# Patient Record
Sex: Female | Born: 1955 | Race: Black or African American | Hispanic: No | Marital: Single | Smoking: Current every day smoker
Health system: Southern US, Community
[De-identification: ages and names within clinical notes are randomized; demographics above are authoritative.]

## PROBLEM LIST (undated history)

## (undated) DIAGNOSIS — I10 Essential (primary) hypertension: Secondary | ICD-10-CM

## (undated) DIAGNOSIS — F32A Depression, unspecified: Secondary | ICD-10-CM

## (undated) DIAGNOSIS — K219 Gastro-esophageal reflux disease without esophagitis: Secondary | ICD-10-CM

## (undated) DIAGNOSIS — F419 Anxiety disorder, unspecified: Secondary | ICD-10-CM

## (undated) DIAGNOSIS — F329 Major depressive disorder, single episode, unspecified: Secondary | ICD-10-CM

## (undated) DIAGNOSIS — H409 Unspecified glaucoma: Secondary | ICD-10-CM

## (undated) DIAGNOSIS — D242 Benign neoplasm of left breast: Secondary | ICD-10-CM

## (undated) HISTORY — PX: EYE SURGERY: SHX253

## (undated) HISTORY — PX: OTHER SURGICAL HISTORY: SHX169

## (undated) HISTORY — PX: TUBAL LIGATION: SHX77

---
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
## 2000-09-16 ENCOUNTER — Other Ambulatory Visit: Admission: RE | Admit: 2000-09-16 | Discharge: 2000-09-16 | Payer: Self-pay | Admitting: Family Medicine

## 2001-07-30 ENCOUNTER — Other Ambulatory Visit: Admission: RE | Admit: 2001-07-30 | Discharge: 2001-07-30 | Payer: Self-pay | Admitting: Family Medicine

## 2001-08-02 ENCOUNTER — Encounter: Payer: Self-pay | Admitting: Family Medicine

## 2001-08-02 ENCOUNTER — Ambulatory Visit (HOSPITAL_COMMUNITY): Admission: RE | Admit: 2001-08-02 | Discharge: 2001-08-02 | Payer: Self-pay | Admitting: *Deleted

## 2001-08-05 ENCOUNTER — Encounter: Payer: Self-pay | Admitting: Family Medicine

## 2001-08-05 ENCOUNTER — Ambulatory Visit (HOSPITAL_COMMUNITY): Admission: RE | Admit: 2001-08-05 | Discharge: 2001-08-05 | Payer: Self-pay | Admitting: Family Medicine

## 2001-08-23 ENCOUNTER — Emergency Department (HOSPITAL_COMMUNITY): Admission: EM | Admit: 2001-08-23 | Discharge: 2001-08-23 | Payer: Self-pay | Admitting: Emergency Medicine

## 2002-05-03 ENCOUNTER — Encounter: Payer: Self-pay | Admitting: Emergency Medicine

## 2002-05-03 ENCOUNTER — Emergency Department (HOSPITAL_COMMUNITY): Admission: EM | Admit: 2002-05-03 | Discharge: 2002-05-03 | Payer: Self-pay | Admitting: Emergency Medicine

## 2005-04-24 ENCOUNTER — Ambulatory Visit: Payer: Self-pay | Admitting: Family Medicine

## 2005-04-29 ENCOUNTER — Ambulatory Visit (HOSPITAL_COMMUNITY): Admission: RE | Admit: 2005-04-29 | Discharge: 2005-04-29 | Payer: Self-pay | Admitting: Family Medicine

## 2005-05-22 ENCOUNTER — Ambulatory Visit: Payer: Self-pay | Admitting: Family Medicine

## 2006-05-05 HISTORY — PX: COLONOSCOPY WITH ESOPHAGOGASTRODUODENOSCOPY (EGD): SHX5779

## 2006-06-04 ENCOUNTER — Ambulatory Visit: Payer: Self-pay | Admitting: Family Medicine

## 2006-06-04 ENCOUNTER — Encounter: Payer: Self-pay | Admitting: Family Medicine

## 2006-06-04 ENCOUNTER — Other Ambulatory Visit: Admission: RE | Admit: 2006-06-04 | Discharge: 2006-06-04 | Payer: Self-pay | Admitting: Family Medicine

## 2006-06-04 LAB — CONVERTED CEMR LAB: Pap Smear: NORMAL

## 2006-06-09 ENCOUNTER — Ambulatory Visit (HOSPITAL_COMMUNITY): Admission: RE | Admit: 2006-06-09 | Discharge: 2006-06-09 | Payer: Self-pay | Admitting: Family Medicine

## 2006-06-24 ENCOUNTER — Ambulatory Visit: Payer: Self-pay | Admitting: Gastroenterology

## 2006-07-03 ENCOUNTER — Encounter (INDEPENDENT_AMBULATORY_CARE_PROVIDER_SITE_OTHER): Payer: Self-pay | Admitting: Specialist

## 2006-07-03 ENCOUNTER — Ambulatory Visit (HOSPITAL_COMMUNITY): Admission: RE | Admit: 2006-07-03 | Discharge: 2006-07-03 | Payer: Self-pay | Admitting: Gastroenterology

## 2006-07-03 ENCOUNTER — Ambulatory Visit: Payer: Self-pay | Admitting: Gastroenterology

## 2006-10-15 ENCOUNTER — Ambulatory Visit: Payer: Self-pay | Admitting: Family Medicine

## 2007-05-06 ENCOUNTER — Encounter: Payer: Self-pay | Admitting: Family Medicine

## 2007-06-28 ENCOUNTER — Ambulatory Visit: Payer: Self-pay | Admitting: Family Medicine

## 2007-07-07 ENCOUNTER — Encounter: Payer: Self-pay | Admitting: Family Medicine

## 2007-07-07 ENCOUNTER — Ambulatory Visit (HOSPITAL_COMMUNITY): Admission: RE | Admit: 2007-07-07 | Discharge: 2007-07-07 | Payer: Self-pay | Admitting: Family Medicine

## 2007-07-07 LAB — CONVERTED CEMR LAB
ALT: 22 units/L (ref 0–35)
AST: 25 units/L (ref 0–37)
Albumin: 4.8 g/dL (ref 3.5–5.2)
CO2: 16 meq/L — ABNORMAL LOW (ref 19–32)
Calcium: 9.8 mg/dL (ref 8.4–10.5)
Cholesterol: 221 mg/dL — ABNORMAL HIGH (ref 0–200)
HDL: 52 mg/dL (ref >39)
Sodium: 138 meq/L (ref 135–145)
Total Bilirubin: 0.9 mg/dL (ref 0.3–1.2)
Total CHOL/HDL Ratio: 4.3
Total Protein: 7.8 g/dL (ref 6.0–8.3)
Triglycerides: 139 mg/dL (ref <150)

## 2007-10-12 ENCOUNTER — Ambulatory Visit: Payer: Self-pay | Admitting: Family Medicine

## 2007-10-15 ENCOUNTER — Encounter: Payer: Self-pay | Admitting: Family Medicine

## 2007-10-15 DIAGNOSIS — E669 Obesity, unspecified: Secondary | ICD-10-CM

## 2007-10-15 DIAGNOSIS — F329 Major depressive disorder, single episode, unspecified: Secondary | ICD-10-CM

## 2007-10-15 DIAGNOSIS — F17200 Nicotine dependence, unspecified, uncomplicated: Secondary | ICD-10-CM

## 2007-10-15 DIAGNOSIS — N61 Mastitis without abscess: Secondary | ICD-10-CM | POA: Insufficient documentation

## 2007-10-15 DIAGNOSIS — M79609 Pain in unspecified limb: Secondary | ICD-10-CM

## 2007-10-19 ENCOUNTER — Ambulatory Visit: Payer: Self-pay | Admitting: Family Medicine

## 2008-12-01 ENCOUNTER — Ambulatory Visit: Payer: Self-pay | Admitting: Family Medicine

## 2008-12-02 DIAGNOSIS — B369 Superficial mycosis, unspecified: Secondary | ICD-10-CM | POA: Insufficient documentation

## 2008-12-02 DIAGNOSIS — K649 Unspecified hemorrhoids: Secondary | ICD-10-CM

## 2008-12-02 DIAGNOSIS — K621 Rectal polyp: Secondary | ICD-10-CM

## 2008-12-02 DIAGNOSIS — K620 Anal polyp: Secondary | ICD-10-CM | POA: Insufficient documentation

## 2008-12-02 DIAGNOSIS — E785 Hyperlipidemia, unspecified: Secondary | ICD-10-CM

## 2008-12-05 ENCOUNTER — Encounter: Payer: Self-pay | Admitting: Family Medicine

## 2008-12-05 LAB — CONVERTED CEMR LAB
Basophils Absolute: 0.1 10*3/uL (ref 0.0–0.1)
Basophils Relative: 1 % (ref 0–1)
Calcium: 9.1 mg/dL (ref 8.4–10.5)
Cholesterol: 165 mg/dL (ref 0–200)
Lymphocytes Relative: 34 % (ref 12–46)
Neutro Abs: 4.4 10*3/uL (ref 1.7–7.7)
Neutrophils Relative %: 58 % (ref 43–77)
Platelets: 221 10*3/uL (ref 150–400)
Potassium: 4.6 meq/L (ref 3.5–5.3)
RDW: 13.9 % (ref 11.5–15.5)
Sodium: 143 meq/L (ref 135–145)
Total CHOL/HDL Ratio: 3.2
Triglycerides: 161 mg/dL — ABNORMAL HIGH (ref <150)
VLDL: 32 mg/dL (ref 0–40)

## 2008-12-06 ENCOUNTER — Ambulatory Visit (HOSPITAL_COMMUNITY): Admission: RE | Admit: 2008-12-06 | Discharge: 2008-12-06 | Payer: Self-pay | Admitting: Family Medicine

## 2009-01-26 ENCOUNTER — Ambulatory Visit: Payer: Self-pay | Admitting: Family Medicine

## 2009-01-26 ENCOUNTER — Other Ambulatory Visit: Admission: RE | Admit: 2009-01-26 | Discharge: 2009-01-26 | Payer: Self-pay | Admitting: Family Medicine

## 2009-01-26 ENCOUNTER — Encounter: Payer: Self-pay | Admitting: Family Medicine

## 2009-01-26 DIAGNOSIS — N760 Acute vaginitis: Secondary | ICD-10-CM | POA: Insufficient documentation

## 2009-01-26 LAB — CONVERTED CEMR LAB: Chlamydia, DNA Probe: NEGATIVE

## 2009-01-27 ENCOUNTER — Encounter: Payer: Self-pay | Admitting: Family Medicine

## 2009-01-29 ENCOUNTER — Telehealth: Payer: Self-pay | Admitting: Family Medicine

## 2009-01-29 LAB — CONVERTED CEMR LAB
Candida species: NEGATIVE
Gardnerella vaginalis: POSITIVE — AB

## 2009-02-05 ENCOUNTER — Telehealth: Payer: Self-pay | Admitting: Family Medicine

## 2009-02-05 DIAGNOSIS — L0291 Cutaneous abscess, unspecified: Secondary | ICD-10-CM

## 2009-02-05 DIAGNOSIS — L0390 Cellulitis, unspecified: Secondary | ICD-10-CM

## 2009-02-16 ENCOUNTER — Telehealth: Payer: Self-pay | Admitting: Family Medicine

## 2009-06-07 ENCOUNTER — Ambulatory Visit: Payer: Self-pay | Admitting: Family Medicine

## 2009-06-07 DIAGNOSIS — J019 Acute sinusitis, unspecified: Secondary | ICD-10-CM | POA: Insufficient documentation

## 2009-06-07 DIAGNOSIS — R519 Headache, unspecified: Secondary | ICD-10-CM | POA: Insufficient documentation

## 2009-06-07 DIAGNOSIS — R51 Headache: Secondary | ICD-10-CM

## 2009-06-07 DIAGNOSIS — R079 Chest pain, unspecified: Secondary | ICD-10-CM | POA: Insufficient documentation

## 2009-07-13 ENCOUNTER — Ambulatory Visit: Payer: Self-pay | Admitting: Family Medicine

## 2009-07-17 ENCOUNTER — Encounter: Payer: Self-pay | Admitting: Physician Assistant

## 2009-07-17 LAB — CONVERTED CEMR LAB: GC Probe Amp, Genital: NEGATIVE

## 2009-07-18 LAB — CONVERTED CEMR LAB
Candida species: NEGATIVE
Gardnerella vaginalis: POSITIVE — AB

## 2009-07-31 ENCOUNTER — Ambulatory Visit: Payer: Self-pay | Admitting: Family Medicine

## 2009-07-31 DIAGNOSIS — B009 Herpesviral infection, unspecified: Secondary | ICD-10-CM | POA: Insufficient documentation

## 2009-07-31 DIAGNOSIS — L259 Unspecified contact dermatitis, unspecified cause: Secondary | ICD-10-CM

## 2009-08-02 ENCOUNTER — Telehealth: Payer: Self-pay | Admitting: Physician Assistant

## 2010-03-21 ENCOUNTER — Emergency Department (HOSPITAL_COMMUNITY): Admission: EM | Admit: 2010-03-21 | Discharge: 2010-03-21 | Payer: Self-pay | Admitting: Emergency Medicine

## 2010-05-26 ENCOUNTER — Encounter: Payer: Self-pay | Admitting: Family Medicine

## 2010-06-04 NOTE — Letter (Signed)
Summary: history and physical  history and physical   Imported By: Curtis Sites 10/08/2009 10:22:29  _____________________________________________________________________  External Attachment:    Type:   Image     Comment:   External Document

## 2010-06-04 NOTE — Letter (Signed)
Summary: progress notes  progress notes   Imported By: Curtis Sites 10/08/2009 10:27:50  _____________________________________________________________________  External Attachment:    Type:   Image     Comment:   External Document

## 2010-06-04 NOTE — Letter (Signed)
Summary: consult  consult   Imported By: Curtis Sites 10/08/2009 10:24:15  _____________________________________________________________________  External Attachment:    Type:   Image     Comment:   External Document

## 2010-06-04 NOTE — Letter (Signed)
Summary: phone notes  phone notes   Imported By: Curtis Sites 10/08/2009 10:24:43  _____________________________________________________________________  External Attachment:    Type:   Image     Comment:   External Document

## 2010-06-04 NOTE — Letter (Signed)
Summary: demographic  demographic   Imported By: Curtis Sites 10/08/2009 10:21:20  _____________________________________________________________________  External Attachment:    Type:   Image     Comment:   External Document

## 2010-06-04 NOTE — Letter (Signed)
Summary: misc  misc   Imported By: Curtis Sites 10/08/2009 10:26:45  _____________________________________________________________________  External Attachment:    Type:   Image     Comment:   External Document

## 2010-06-04 NOTE — Letter (Signed)
Summary: labs  labs   Imported By: Curtis Sites 10/08/2009 10:25:36  _____________________________________________________________________  External Attachment:    Type:   Image     Comment:   External Document

## 2010-06-04 NOTE — Assessment & Plan Note (Signed)
Summary: OV   Vital Signs:  Patient profile:   55 year old female Menstrual status:  postmenopausal Height:      64 inches Weight:      182 pounds BMI:     31.35 O2 Sat:      96 % Pulse rate:   112 / minute Pulse rhythm:   regular Resp:     16 per minute BP sitting:   130 / 82 Cuff size:   regular  Vitals Entered By: Everitt Amber (June 07, 2009 2:50 PM)  Nutrition Counseling: Patient's BMI is greater than 25 and therefore counseled on weight management options. CC: having shooting pains in left breast for the past 2 days, nasal and chest congestion bringing up yellowish phlegm. Short winded and states she has been dizzy for 2 days   CC:  having shooting pains in left breast for the past 2 days and nasal and chest congestion bringing up yellowish phlegm. Short winded and states she has been dizzy for 2 days.  History of Present Illness: 2 day h/o dizziness and heasd congestion,, yellow nasal drainge, mainly from the right nostril.No sore throat cough productive of sputum. Sticking left breasrt pain  and spasm in the left forearm which radiates to the elbow which will lock up x 2 days.  Preventive Screening-Counseling & Management  Alcohol-Tobacco     Smoking Cessation Counseling: yes  Current Medications (verified): 1)  None  Allergies (verified): No Known Drug Allergies  Review of Systems      See HPI General:  Complains of chills, fatigue, and malaise; denies fever and loss of appetite. ENT:  Complains of nasal congestion, postnasal drainage, and sinus pressure. CV:  Complains of chest pain or discomfort and shortness of breath with exertion; denies difficulty breathing while lying down, lightheadness, near fainting, palpitations, and swelling of feet; primarily with cough x 2 days. Resp:  Complains of cough and sputum productive; 2 day history. GI:  Denies abdominal pain, constipation, nausea, and vomiting. GU:  Denies dysuria and urinary frequency. MS:  Complains  of joint pain, low back pain, mid back pain, muscle weakness, and stiffness. Derm:  Denies itching and rash. Neuro:  Complains of headaches and sensation of room spinning; denies poor balance. Psych:  Complains of anxiety and depression; denies easily tearful, irritability, suicidal thoughts/plans, thoughts of violence, and unusual visions or sounds. Endo:  Denies cold intolerance, excessive hunger, excessive thirst, excessive urination, heat intolerance, polyuria, and weight change. Heme:  Denies abnormal bruising and bleeding. Allergy:  Complains of seasonal allergies and sneezing; denies hives or rash.  Physical Exam  General:  Well-developed,well-nourished,in no acute distress; alert,appropriate and cooperative throughout examination HEENT: No facial asymmetry,  EOMI, frontal and maxillary sinus tenderness, TM's Clear, oropharynx  erythematous, poor dentition.   Chest: Clear to auscultation bilaterally.decreased air entry throughout Reproducible ant chest wall tenderness, 3rd, 4th and 5ht CC junctions CVS: S1, S2, No murmurs, No S3.   Abd: Soft, Nontender.  MS: Adequate ROM spine, hips, shoulders and knees.  Ext: No edema.   CNS: CN 2-12 intact, power tone and sensation normal throughout.   Skin: Intact, no visible lesions or rashesLeft axillary adenitis.  Psych: Good eye contact, normal affect.  Memory intact, not anxious or depressed appearing.    Impression & Recommendations:  Problem # 1:  HEADACHE (ICD-784.0) Assessment Comment Only  Orders: Ketorolac-Toradol 15mg  (Z6010) Admin of Therapeutic Inj  intramuscular or subcutaneous (93235)  Problem # 2:  CHEST PAIN UNSPECIFIED (ICD-786.50)  Assessment: Comment Only  Orders: EKG w/ Interpretation (93000)  nSR, no evidence of ischemia, pt reassured that noi sign of cAD  Problem # 3:  ACUTE SINUSITIS, UNSPECIFIED (ICD-461.9) Assessment: Comment Only  The following medications were removed from the medication list:     Doxycycline Hyclate 100 Mg Caps (Doxycycline hyclate) .Marland Kitchen... Take 1 capsule by mouth two times a day    Flagyl 500 Mg Tabs (Metronidazole) .Marland Kitchen... Take 1 tablet by mouth two times a day Her updated medication list for this problem includes:    Penicillin V Potassium 500 Mg Tabs (Penicillin v potassium) .Marland Kitchen... Take 1 tablet by mouth three times a day    Tessalon Perles 100 Mg Caps (Benzonatate) .Marland Kitchen... Take 1 capsule by mouth three times a day  Complete Medication List: 1)  Penicillin V Potassium 500 Mg Tabs (Penicillin v potassium) .... Take 1 tablet by mouth three times a day 2)  Tessalon Perles 100 Mg Caps (Benzonatate) .... Take 1 capsule by mouth three times a day 3)  Fluconazole 150 Mg Tabs (Fluconazole) .... Take 1 tablet by mouth once a day as needed  Patient Instructions: 1)  Please schedule a follow-up appointment in 3 months.PLS cancel earlier appt. 2)  Tobacco is very bad for your health and your loved ones! You Should stop smoking!. 3)  Stop Smoking Tips: Choose a Quit date. Cut down before the Quit date. decide what you will do as a substitute when you feel the urge to smoke(gum,toothpick,exercise). 4)  It is important that you exercise regularly at least 20 minutes 5 times a week. If you develop chest pain, have severe difficulty breathing, or feel very tired , stop exercising immediately and seek medical attention. 5)  You need to lose weight. Consider a lower calorie diet and regular exercise.  6)  you are  being treated for sinusitis, headache and left axillary adenitis, you will get injections in the office and meds are also sent to your pharmacy. Prescriptions: FLUCONAZOLE 150 MG TABS (FLUCONAZOLE) Take 1 tablet by mouth once a day as needed  #3 x 0   Entered and Authorized by:   Syliva Overman MD   Signed by:   Syliva Overman MD on 06/07/2009   Method used:   Electronically to        [facility] [address] [phone]* (retail)       [address] [place], [place]   [address]       Ph: [phone]       Fax: [phone]   RxID:   [id number] TESSALON PERLES 100 MG CAPS (BENZONATATE) Take 1 capsule by mouth three times a day  #30 x 0   Entered and Authorized by:   Syliva Overman MD   Signed by:   Syliva Overman MD on 06/07/2009   Method used:   Electronically to        [facility] [address] [phone]* (retail)       [address] [place]  [address]       Ph: [phone]       Fax: [phone]   RxID:   [id number] PENICILLIN V POTASSIUM 500 MG TABS (PENICILLIN V POTASSIUM) Take 1 tablet by mouth three times a day  #30 x 0   Entered and Authorized by:   Syliva Overman MD   Signed by:   Syliva Overman MD on 06/07/2009   Method used:   Electronically  to        [facility] [phone]* (retail)       [address] [place], [place]  [address]       Ph: [phone]       Fax: [phone]   RxID:   [id number]    Medication Administration  Injection # 1:    Medication: Ketorolac-Toradol 15mg     Diagnosis: HEADACHE (ICD-784.0)    Route: IM    Site: RUOQ gluteus    Exp Date: 01/2012    Lot #: [id number]    Mfr: novaplus    Comments: 60mg  given     Patient tolerated injection without complications    Given by: Everitt Amber (June 07, 2009 4:23 PM)  Orders Added: 1)  Est. Patient Level IV [[address]] 2)  EKG w/ Interpretation [93000] 3)  Ketorolac-Toradol 15mg  [J1885] 4)  Admin of Therapeutic Inj  intramuscular or subcutaneous [18841]

## 2010-06-04 NOTE — Letter (Signed)
Summary: xray  xray   Imported By: Curtis Sites 10/08/2009 10:23:23  _____________________________________________________________________  External Attachment:    Type:   Image     Comment:   External Document

## 2010-06-04 NOTE — Progress Notes (Signed)
Summary: meds  Phone Note Refill Request   Summary of Call: would like to speak with Davide Risdon. about meds. [phone] Initial call taken by: Rudene Anda,  August 02, 2009 10:50 AM  Follow-up for Phone Call        patient wants to speak with you reguarding her new diagnosis and medication. offered to discuss and also mail her info but patient declined, she feels more comfortable discussing with Christos Mixson Follow-up by: Adella Hare LPN,  August 02, 2009 10:59 AM  Additional Follow-up for Phone Call Additional follow up Details #1::        Answered pts questions re: HSV.  Primarily about transmission. Refilled Valtrex to have on hand for future outbreaks. Pt states she is losing her ins effective tomorrow. Additional Follow-up by: Esperanza Sheets PA,  August 02, 2009 2:58 PM    New/Updated Medications: VALTREX 500 MG TABS (VALACYCLOVIR HCL) take 1 two times a day as directed Prescriptions: VALTREX 500 MG TABS (VALACYCLOVIR HCL) take 1 two times a day as directed  #60 x 0   Entered and Authorized by:   Esperanza Sheets PA   Signed by:   Esperanza Sheets PA on 08/02/2009   Method used:   Electronically to        [facility] [phone]* (retail)       [address], [place]  [address]       Ph: [phone]       Fax: [phone]   RxID:   [id number]

## 2010-06-04 NOTE — Assessment & Plan Note (Signed)
Summary: vaginal irritation - room 1   Vital Signs:  Patient profile:   55 year old female Menstrual status:  postmenopausal Height:      64 inches Weight:      184.50 pounds BMI:     31.78 O2 Sat:      96 % on Room air Pulse rate:   92 / minute Resp:     16 per minute BP sitting:   140 / 70  (left arm)  Vitals Entered By: Adella Hare LPN (July 13, 2009 11:11 AM)  Nutrition Counseling: Patient's BMI is greater than 25 and therefore counseled on weight management options. CC: vaginal irritation Is Patient Diabetic? No Pain Assessment Patient in pain? no        CC:  vaginal irritation.  History of Present Illness: Pt is here today with c/o genital itching, irritation & vag dischg x 2-3 days.  No pelvic pain.  No vag bleeding.  She did have a new partner recently , but states he wore a condom.  Pt did have trichomonis this past fall.  She did complete her prescription but is uncertain if her long term partner took his.  She was also recently seen for cough & congestion.  She completed her antibiotics.  States her syptoms are much better but still has a little cough.  She smokes off & on.  "I had one today."  Current Medications (verified): 1)  Penicillin V Potassium 500 Mg Tabs (Penicillin V Potassium) .... Take 1 Tablet By Mouth Three Times A Day 2)  Tessalon Perles 100 Mg Caps (Benzonatate) .... Take 1 Capsule By Mouth Three Times A Day 3)  Fluconazole 150 Mg Tabs (Fluconazole) .... Take 1 Tablet By Mouth Once A Day As Needed  Allergies (verified): No Known Drug Allergies  Past History:  Past medical history reviewed for relevance to current acute and chronic problems.  Past Medical History: Reviewed history from 10/15/2007 and no changes required. INFLAMMATORY DISEASE OF BREAST (ICD-611.0) LEG PAIN, BILATERAL (ICD-729.5) NICOTINE ADDICTION (ICD-305.1) DEPRESSION (ICD-311) OBESITY (ICD-278.00)  Review of Systems General:  Denies chills and fever. ENT:  Denies  nasal congestion and sore throat. Resp:  Complains of cough. GI:  Denies abdominal pain and change in bowel habits. GU:  Complains of discharge; denies abnormal vaginal bleeding, dysuria, and urinary frequency.  Physical Exam  General:  Well-developed,well-nourished,in no acute distress; alert,appropriate and cooperative throughout examination Head:  Normocephalic and atraumatic without obvious abnormalities. No apparent alopecia or balding. Lungs:  Normal respiratory effort, chest expands symmetrically. Lungs are clear to auscultation, no crackles or wheezes. Heart:  Normal rate and regular rhythm. S1 and S2 normal without gallop, murmur, click, rub or other extra sounds. Genitalia:  normal introitus, no external lesions, no vaginal or cervical lesions, normal uterus size and position, no adnexal masses or tenderness, and small amt of vaginal discharge- thin & white.  Psych:  Cognition and judgment appear intact. Alert and cooperative with normal attention span and concentration. No apparent delusions, illusions, hallucinations   Impression & Recommendations:  Problem # 1:  VAGINITIS (ICD-616.10) Assessment New  Her updated medication list for this problem includes:    Penicillin V Potassium 500 Mg Tabs (Penicillin v potassium) .Marland Kitchen... Take 1 tablet by mouth three times a day    Metronidazole 500 Mg Tabs (Metronidazole) ..... One two times a day x 7 days.  no alcohol while taking or for 3 days after.  Orders: T-Wet Prep by Molecular Probe [phone]) T-Chlamydia & GC  Probe, Genital ([id number])  Problem # 2:  NICOTINE ADDICTION (ICD-305.1) Assessment: Unchanged  Encouraged smoking cessation.  Pt will go a couple of days without smoking.  I believe she can quit on her own & encouraged her to do so.  Complete Medication List: 1)  Penicillin V Potassium 500 Mg Tabs (Penicillin v potassium) .... Take 1 tablet by mouth three times a day 2)  Tessalon Perles 100 Mg Caps  (Benzonatate) .... Take 1 capsule by mouth three times a day 3)  Fluconazole 150 Mg Tabs (Fluconazole) .... Take 1 tablet by mouth once a day as needed 4)  Metronidazole 500 Mg Tabs (Metronidazole) .... One two times a day x 7 days.  no alcohol while taking or for 3 days after.  Patient Instructions: 1)  Please schedule a follow-up appointment as needed. 2)  Tobacco is very bad for your health and your loved ones! You Should stop smoking!. 3)  Stop Smoking Tips: Choose a Quit date. Cut down before the Quit date. decide what you will do as a substitute when you feel the urge to smoke(gum,toothpick,exercise). 4)  It is important that you exercise regularly at least 20 minutes 5 times a week. If you develop chest pain, have severe difficulty breathing, or feel very tired , stop exercising immediately and seek medical attention. 5)  You need to lose weight. Consider a lower calorie diet and regular exercise.  6)  I have prescribed antibiotics for your vaginal infection to the pharmacy. Prescriptions: METRONIDAZOLE 500 MG TABS (METRONIDAZOLE) one two times a day x 7 days.  No alcohol while taking or for 3 days after.  #14 x 0   Entered and Authorized by:   Esperanza Sheets PA   Signed by:   Adella Hare LPN on 16/02/9603   Method used:   Electronically to        [facility] [address] [phone]* (retail)       [address], [place]  [address]       Ph: [phone]       Fax: [phone]   RxID:   [id number]

## 2010-06-04 NOTE — Assessment & Plan Note (Signed)
Summary: rash- room 2   Vital Signs:  Patient profile:   55 year old female Menstrual status:  postmenopausal Height:      64 inches Weight:      183.75 pounds BMI:     31.65 O2 Sat:      98 % on Room air Pulse rate:   92 / minute Resp:     16 per minute BP sitting:   142 / 90  (left arm)  Vitals Entered By: Adella Hare LPN (July 31, 2009 3:20 PM) CC: vaginal irritation and rash on buttocks Is Patient Diabetic? No Pain Assessment Patient in pain? no        CC:  vaginal irritation and rash on buttocks.  History of Present Illness: Pt is here today with c/o a recurrent rash on her buttock.  States that she has had this off & on x mos but seems to be coming more freq lately.  Starts as an itch.  Then gets bumps that are tender.  Scabs up. Then heals & goes away.  Also recently dx with BV.  States she did not take the pills as prescribed.  Sxs are the same.  Also admits that she did drink ETOH with the pills even though was advised not to.   Current Medications (verified): 1)  Penicillin V Potassium 500 Mg Tabs (Penicillin V Potassium) .... Take 1 Tablet By Mouth Three Times A Day 2)  Tessalon Perles 100 Mg Caps (Benzonatate) .... Take 1 Capsule By Mouth Three Times A Day 3)  Fluconazole 150 Mg Tabs (Fluconazole) .... Take 1 Tablet By Mouth Once A Day As Needed  Allergies (verified): No Known Drug Allergies  Past History:  Past medical history reviewed for relevance to current acute and chronic problems.  Past Medical History: Reviewed history from 10/15/2007 and no changes required. INFLAMMATORY DISEASE OF BREAST (ICD-611.0) LEG PAIN, BILATERAL (ICD-729.5) NICOTINE ADDICTION (ICD-305.1) DEPRESSION (ICD-311) OBESITY (ICD-278.00)  Review of Systems General:  Denies chills and fever. GU:  Complains of discharge; denies dysuria and genital sores. Derm:  Complains of rash.  Physical Exam  General:  Well-developed,well-nourished,in no acute distress;  alert,appropriate and cooperative throughout examination Head:  Normocephalic and atraumatic without obvious abnormalities. No apparent alopecia or balding. Skin:  Rt medial buttock dime sized area of grouped vesicles with mildly erythematous base. Psych:  normally interactive, good eye contact, and not anxious appearing.     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  She denies prev hx of.  Not aware of partner having HSV either. Will order lab test to confirm. Valtrex prescription given to treat this outbreak.  Problem # 2:  BACTERIAL VAGINITIS (ICD-616.10) Assessment: Unchanged  The following medications were removed from the medication list:    Penicillin V Potassium 500 Mg Tabs (Penicillin v potassium) .Marland Kitchen... Take 1 tablet by mouth three times a day Her updated medication list for this problem includes:    Metrogel-vaginal 0.75 % Gel (Metronidazole) ..... Use 1 appl hs x 5 nights  Complete Medication List: 1)  Valtrex 500 Mg Tabs (Valacyclovir hcl) .... Take 1 two times a day x 3 days for rash 2)  Metrogel-vaginal 0.75 % Gel (Metronidazole) .... Use 1 appl hs x 5 nights  Other Orders: T-Herpes Simplex Type 2 ([id number])  Patient Instructions: 1)  Please schedule a follow-up appointment as needed. 2)  I have prescribed pills to take two times a  day for 3 days for the rash on your bottom.  I have also prescribed a vaginal cream to use at bedtime for 5 nights to treat your vaginal infection. Prescriptions: METROGEL-VAGINAL 0.75 % GEL (METRONIDAZOLE) use 1 appl HS x 5 nights  #1 x 0   Entered and Authorized by:   Esperanza Sheets PA   Signed by:   Esperanza Sheets PA on 07/31/2009   Method used:   Electronically to        [facility] [phone]* (retail)       [address] [place], [place]  [address]       Ph: [phone]       Fax: [phone]   RxID:   [id number] VALTREX 500 MG  TABS (VALACYCLOVIR HCL) take 1 two times a day x 3 days for rash  #6 x 0   Entered and Authorized by:   Esperanza Sheets PA   Signed by:   Esperanza Sheets PA on 07/31/2009   Method used:   Electronically to        [facility] [phone]* (retail)       [address] [place]  [address]       Ph: [phone]       Fax: [phone]   RxID:   [id number]

## 2010-09-20 NOTE — Consult Note (Signed)
NAME:  Alison Hansen, Alison Hansen              ACCOUNT NO.:  [id number]   MEDICAL RECORD NO.:  [id number]          PATIENT TYPE:  AMB   LOCATION:  DAY                           FACILITY:  [facility]   PHYSICIAN:  Kassie Mends, M.D.      DATE OF BIRTH:  Jan 29, 1956   DATE OF CONSULTATION:  06/24/2006  DATE OF DISCHARGE:                                 CONSULTATION   REASON FOR CONSULTATION:  Rectal bleeding and dyspepsia.   HISTORY OF PRESENT ILLNESS:  Alison Hansen is a 55 year old female with  rectal bleeding for 2 years. She has also had onset of heart burn and  indigestion over the last 4-5 months. She has had rectal bleeding for 2  years. She states that she has seen bleeding from her rectum every 3-4  days. The bleeding is in her stool. Sometimes it is more than others.  She has seen it run sometimes like a period. She has been attributing it  to hemorrhoids. She denies any diarrhea or black tarry stools.   Her heart burn and indigestion is 2-3 times a week. She states it  primarily is brought on by something she eats. She has abdominal pain  sometimes. She has gained 10 pounds over the last 6 months to a year.  Sometimes she strains when she has a bowel movement. She strains once a  week. She denies any difficulty swallowing. She was told she had  cirrhosis approximately 7-8 years ago. I have no record available to me  regarding her diagnosis of cirrhosis today.   PAST MEDICAL HISTORY:  1. Reported history of cirrhosis.  2. Back pain.   PAST SURGICAL HISTORY:  1. Tubal ligation.  2. Eye surgery.   ALLERGIES:  No known drug allergies.   MEDICATIONS:  1. Meloxicam 15 mg p.r.n. for back pain over the last 2 weeks.  2. Cyclobenzaprine 10 mg at h.s. p.r.n. off and on for the last 2      weeks.   The patient denies any use of aspirin, BC's or Goodie powders.   FAMILY HISTORY:  She denies any family history of breast cancer,  ovarian, uterine or colon cancer. She has no family history of  colon  polyps. Her brother had stomach cancer but was a frequent drinker of  wine.   SOCIAL HISTORY:  She has four children ages 30, 38, 39 and 42. She  formerly was a Scientist, product/process development. She smokes 1/2 pack a day. She drinks 3-4  beers a day at times. She may drink up to a 6-pack a day. Her last drink  was last night.   REVIEW OF SYSTEMS:  As per the HPI. All other systems negative.   PHYSICAL EXAMINATION:  VITAL SIGNS: Weight 191 pounds, height 5 feet, 4  inches, BMI 32.8 (obese). Temperature 98.3, blood pressure 134/78, pulse  80.GENERAL: She is in no apparent distress. Alert and oriented x4. HEENT  EXAM: Normocephalic, atraumatic. Pupils are equal, round and reactive to  light. Mouth - no oral lesions, posterior pharynx without erythema or  exudate. NECK: Full range of motion, no lymphadenopathy.CARDIOVASCULAR  EXAM:  Regular rhythm, no murmur, normal S1 and S2. ABDOMEN: Bowel sounds  present, soft, nontender, nondistended. No rebound or guarding, unable  to appreciate hepatosplenomegaly. She has no abdominal bruits or  pulsatile masses, obese. EXTREMITIES: Are without cyanosis, clubbing or  edema. NEURO: She has no focal neurologic deficits.   ASSESSMENT:  Alison Hansen is a 55 year old female with rectal bleeding  for 2 years. She has reported history of cirrhosis and a known history  of alcohol abuse. The differential diagnosis includes internal  hemorrhoids, rectal polyp and a low likelihood of colorectal cancer. She  has new onset of dyspepsia and the differential diagnosis includes  gastric malignancy versus gastroesophageal reflux disease due to weight  gain over the last year and no therapy, versus alcoholic gastritis.   Thank you for allowing me to see Alison Hansen in consultation. My  recommendations are as follows.   RECOMMENDATIONS:  1. Will check CMP, PT, INR and CBC today to evaluate her liver disease      severity.  2. Will schedule a colonoscopy for evaluation of her  rectal bleeding      as well as screening.  3. Will schedule an esophagogastroduodenoscopy to evaluate for new      onset dyspepsia, and screen for varices.  4. Will make further recommendations in regard to her rectal bleeding      after her endoscopy is complete.  5. She has a follow-up visit scheduled for 6 weeks. I will call her      with the results of her lab evaluation.      Kassie Mends, M.D.  Electronically Signed     SM/MEDQ  D:  06/24/2006  T:  06/24/2006  Job:  [id number]   cc:   Milus Mallick. Lodema Hong, M.D.  Fax: [phone]

## 2010-09-20 NOTE — Op Note (Signed)
NAME:  Alison Hansen, Alison Hansen              ACCOUNT NO.:  [id number]   MEDICAL RECORD NO.:  [id number]          PATIENT TYPE:  AMB   LOCATION:  DAY                           FACILITY:  [facility]   PHYSICIAN:  Kassie Mends, M.D.      DATE OF BIRTH:  [birth year]   DATE OF PROCEDURE:  07/03/2006  DATE OF DISCHARGE:                               OPERATIVE REPORT   PROCEDURES:  1. Colonoscopy with cold forceps polypectomy.  2. Esophagogastroduodenoscopy with cold forceps biopsy.   INDICATIONS FOR PROCEDURE:  Ms. Munoz is a 55 year old female who  presented with rectal bleeding for 2 years.  She also had heartburn and  indigestion for 4-5 months which was new in onset.  She drinks 3-4 beers  at a time and sometimes a six pack a day.   FINDINGS:  1. Small rectal polyps removed via cold forceps.  Internal and external hemorrhoids which is likely the source of her  rectal bleeding.  Otherwise, normal colon without evidence of masses,  inflammatory changes, diverticula or AVMs.  1. Normal esophagus without evidence of Barrett's.  No esophageal      varices.  2. Diffuse erythema of the body of the stomach without ulcerations or      erosions.  Biopsies obtained to rule out H pylori or eosinophilic      esophagitis.  Her gastritis is likely secondary to alcohol use.      The differential diagnosis includes portal hypertensive      gastropathy.  She had no evidence of fundal varices.  Normal      duodenal bulb and second portion of the duodenum.  No duodenal      varices.   RECOMMENDATIONS:  1. I will call Ms. Smalling with the results of her biopsies.  She      already has a follow-up appointment to see me within the next 4      weeks.  2. She should avoid alcohol.  No aspirin, NSAIDs or anticoagulation      for the next 2 weeks.  3. Over-the-counter Prilosec daily.  She is given a handout on reflux,      gastritis, and gastric irritants.  She should avoid gastric      irritants.  4. She should also  add fiber to her diet.  She is given a handout on a      high-fiber diet.  5. She is given a handout also on management of hemorrhoids.  She is      given a prescription for Anusol HC one per rectum twice daily for 2      weeks.  She may repeat this once prior to her next visit.  She may      also add Colace 100 mg tablets twice daily until she sees me.  6. If her polyps are adenomatous then she will need a screening      colonoscopy in 5 years. If they are hyperplastic then she will need      a colonoscopy in 10 years.   MEDICATIONS:  1. Colonoscopy - Demerol 100 mg IV, Versed  6 mg IV.  2. Esophagogastroduodenoscopy - Versed 2 mg IV.   PROCEDURE TECHNIQUE:  Physical exam was performed and informed consent  was obtained from the patient after explaining the benefits, risks and  alternatives to the procedure.  The patient was connected to the monitor  and placed in the left lateral position.  Continuous oxygen was provided  by nasal cannula and IV medicine administered through an indwelling  cannula.  After administration of sedation and rectal exam, the  patient's rectum was intubated and the scope was advanced under direct  visualization to the cecum.  The scope was subsequently removed slowly  by carefully examining the color, texture, anatomy and integrity of the  mucosa on the way out.  The patient was then repositioned.  After the  colonoscopy, the patient's esophagus was intubated with a diagnostic  gastroscope.  The scope was advanced under direct visualization to  second portion of duodenum.  The scope was subsequently removed slowly  by carefully examining the color, texture, anatomy and integrity of the  mucosa on the way out.  The patient was recovered in the endoscopy suite  discharged home in satisfactory condition.      Kassie Mends, M.D.  Electronically Signed     SM/MEDQ  D:  07/03/2006  T:  07/03/2006  Job:  [id number]   cc:   Milus Mallick. Lodema Hong, M.D.  Fax:  [phone]

## 2011-06-25 ENCOUNTER — Emergency Department (HOSPITAL_COMMUNITY): Payer: Self-pay

## 2011-06-25 ENCOUNTER — Emergency Department (HOSPITAL_COMMUNITY)
Admission: EM | Admit: 2011-06-25 | Discharge: 2011-06-25 | Disposition: A | Discharge status: Home / Self Care | Payer: No Typology Code available for payment source | Attending: Emergency Medicine | Admitting: Emergency Medicine

## 2011-06-25 ENCOUNTER — Encounter (HOSPITAL_COMMUNITY): Payer: Self-pay | Admitting: *Deleted

## 2011-06-25 DIAGNOSIS — M542 Cervicalgia: Secondary | ICD-10-CM | POA: Insufficient documentation

## 2011-06-25 DIAGNOSIS — M545 Low back pain, unspecified: Secondary | ICD-10-CM | POA: Insufficient documentation

## 2011-06-25 DIAGNOSIS — S39012A Strain of muscle, fascia and tendon of lower back, initial encounter: Secondary | ICD-10-CM

## 2011-06-25 DIAGNOSIS — S161XXA Strain of muscle, fascia and tendon at neck level, initial encounter: Secondary | ICD-10-CM

## 2011-06-25 DIAGNOSIS — R51 Headache: Secondary | ICD-10-CM | POA: Insufficient documentation

## 2011-06-25 DIAGNOSIS — Y9289 Other specified places as the place of occurrence of the external cause: Secondary | ICD-10-CM | POA: Insufficient documentation

## 2011-06-25 DIAGNOSIS — S335XXA Sprain of ligaments of lumbar spine, initial encounter: Secondary | ICD-10-CM | POA: Insufficient documentation

## 2011-06-25 DIAGNOSIS — S139XXA Sprain of joints and ligaments of unspecified parts of neck, initial encounter: Secondary | ICD-10-CM | POA: Insufficient documentation

## 2011-06-25 MED ORDER — CYCLOBENZAPRINE HCL 10 MG PO TABS
10.0000 mg | ORAL_TABLET | Freq: Once | ORAL | Status: AC
  Administered 2011-06-25: 10 mg via ORAL
  Filled 2011-06-25: qty 1

## 2011-06-25 MED ORDER — CYCLOBENZAPRINE HCL 10 MG PO TABS: ORAL_TABLET | ORAL | Status: DC

## 2011-06-25 MED ORDER — IBUPROFEN 800 MG PO TABS
800.0000 mg | ORAL_TABLET | Freq: Once | ORAL | Status: AC
  Administered 2011-06-25: 800 mg via ORAL
  Filled 2011-06-25: qty 1
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
## 2011-06-25 NOTE — Discharge Instructions (Signed)
Cryotherapy Cryotherapy means treatment with cold. Ice or gel packs can be used to reduce both pain and swelling. Ice is the most helpful within the first 24 to 48 hours after an injury or flareup from overusing a muscle or joint. Sprains, strains, spasms, burning pain, shooting pain, and aches can all be eased with ice. Ice can also be used when recovering from surgery. Ice is effective, has very few side effects, and is safe for most people to use. PRECAUTIONS  Ice is not a safe treatment option for people with:  Raynaud's phenomenon. This is a condition affecting small blood vessels in the extremities. Exposure to cold may cause your problems to return.   Cold hypersensitivity. There are many forms of cold hypersensitivity, including:   Cold urticaria. Red, itchy hives appear on the skin when the tissues begin to warm after being iced.   Cold erythema. This is a red, itchy rash caused by exposure to cold.   Cold hemoglobinuria. Red blood cells break down when the tissues begin to warm after being iced. The hemoglobin that carry oxygen are passed into the urine because they cannot combine with blood proteins fast enough.   Numbness or altered sensitivity in the area being iced.  If you have any of the following conditions, do not use ice until you have discussed cryotherapy with your caregiver:  Heart conditions, such as arrhythmia, angina, or chronic heart disease.   High blood pressure.   Healing wounds or open skin in the area being iced.   Current infections.   Rheumatoid arthritis.   Poor circulation.   Diabetes.  Ice slows the blood flow in the region it is applied. This is beneficial when trying to stop inflamed tissues from spreading irritating chemicals to surrounding tissues. However, if you expose your skin to cold temperatures for too long or without the proper protection, you can damage your skin or nerves. Watch for signs of skin damage due to cold. HOME CARE  INSTRUCTIONS Follow these tips to use ice and cold packs safely.  Place a dry or damp towel between the ice and skin. A damp towel will cool the skin more quickly, so you may need to shorten the time that the ice is used.   For a more rapid response, add gentle compression to the ice.   Ice for no more than 10 to 20 minutes at a time. The bonier the area you are icing, the less time it will take to get the benefits of ice.   Check your skin after 5 minutes to make sure there are no signs of a poor response to cold or skin damage.   Rest 20 minutes or more in between uses.   Once your skin is numb, you can end your treatment. You can test numbness by very lightly touching your skin. The touch should be so light that you do not see the skin dimple from the pressure of your fingertip. When using ice, most people will feel these normal sensations in this order: cold, burning, aching, and numbness.   Do not use ice on someone who cannot communicate their responses to pain, such as small children or people with dementia.  HOW TO MAKE AN ICE PACK Ice packs are the most common way to use ice therapy. Other methods include ice massage, ice baths, and cryo-sprays. Muscle creams that cause a cold, tingly feeling do not offer the same benefits that ice offers and should not be used as a substitute  unless recommended by your caregiver. To make an ice pack, do one of the following:  Place crushed ice or a bag of frozen vegetables in a sealable plastic bag. Squeeze out the excess air. Place this bag inside another plastic bag. Slide the bag into a pillowcase or place a damp towel between your skin and the bag.   Mix 3 parts water with 1 part rubbing alcohol. Freeze the mixture in a sealable plastic bag. When you remove the mixture from the freezer, it will be slushy. Squeeze out the excess air. Place this bag inside another plastic bag. Slide the bag into a pillowcase or place a damp towel between your skin  and the bag.  SEEK MEDICAL CARE IF:  You develop white spots on your skin. This may give the skin a blotchy (mottled) appearance.   Your skin turns blue or pale.   Your skin becomes waxy or hard.   Your swelling gets worse.  MAKE SURE YOU:   Understand these instructions.   Will watch your condition.   Will get help right away if you are not doing well or get worse.  Document Released: 12/16/2010 Document Reviewed: 12/12/2010 [facility] Patient Information 2012 [place], [place].Muscle Strain A muscle strain, or pulled muscle, occurs when a muscle is over-stretched. A small number of muscle fibers may also be torn. This is especially common in athletes. This happens when a sudden violent force placed on a muscle pushes it past its capacity. Usually, recovery from a pulled muscle takes 1 to 2 weeks. But complete healing will take 5 to 6 weeks. There are millions of muscle fibers. Following injury, your body will usually return to normal quickly. HOME CARE INSTRUCTIONS   While awake, apply ice to the sore muscle for 15 to 20 minutes each hour for the first 2 days. Put ice in a plastic bag and place a towel between the bag of ice and your skin.   Do not use the pulled muscle for several days. Do not use the muscle if you have pain.   You may wrap the injured area with an elastic bandage for comfort. Be careful not to bind it too tightly. This may interfere with blood circulation.   Only take over-the-counter or prescription medicines for pain, discomfort, or fever as directed by your caregiver. Do not use aspirin as this will increase bleeding (bruising) at injury site.   Warming up before exercise helps prevent muscle strains.  SEEK MEDICAL CARE IF:  There is increased pain or swelling in the affected area. MAKE SURE YOU:   Understand these instructions.   Will watch your condition.   Will get help right away if you are not doing well or get worse.  Document Released: 04/21/2005  Document Revised: 01/01/2011 Document Reviewed: 11/18/2006 [facility] Patient Information 2012 [place], [place].   Take the flexeril as directed.  Take ibuprofen up to 800 mg every 8 hrs with food.  Apply ice 20-30 min several times daily to ares of soreness.  Follow up with dr. Lodema Hong as needed.
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
## 2011-06-25 NOTE — ED Provider Notes (Signed)
Medical screening examination/treatment/procedure(s) were performed by non-physician practitioner and as supervising physician I was immediately available for consultation/collaboration.   Benny Lennert, MD 06/25/11 2240

## 2014-12-22 ENCOUNTER — Emergency Department (HOSPITAL_COMMUNITY)
Admission: EM | Admit: 2014-12-22 | Discharge: 2014-12-22 | Disposition: A | Discharge status: Home / Self Care | Payer: Self-pay | Attending: Emergency Medicine | Admitting: Emergency Medicine

## 2014-12-22 ENCOUNTER — Encounter (HOSPITAL_COMMUNITY): Payer: Self-pay | Admitting: Emergency Medicine

## 2014-12-22 DIAGNOSIS — Y998 Other external cause status: Secondary | ICD-10-CM | POA: Insufficient documentation

## 2014-12-22 DIAGNOSIS — Y9289 Other specified places as the place of occurrence of the external cause: Secondary | ICD-10-CM | POA: Insufficient documentation

## 2014-12-22 DIAGNOSIS — Y9384 Activity, sleeping: Secondary | ICD-10-CM | POA: Insufficient documentation

## 2014-12-22 DIAGNOSIS — W57XXXA Bitten or stung by nonvenomous insect and other nonvenomous arthropods, initial encounter: Secondary | ICD-10-CM | POA: Insufficient documentation

## 2014-12-22 DIAGNOSIS — S70361A Insect bite (nonvenomous), right thigh, initial encounter: Secondary | ICD-10-CM | POA: Insufficient documentation

## 2014-12-22 DIAGNOSIS — Z720 Tobacco use: Secondary | ICD-10-CM | POA: Insufficient documentation

## 2014-12-22 MED ORDER — SULFAMETHOXAZOLE-TRIMETHOPRIM 800-160 MG PO TABS: 1.0000 | ORAL_TABLET | Freq: Two times a day (BID) | ORAL | Status: AC

## 2014-12-22 MED ORDER — ACYCLOVIR 800 MG PO TABS: 800.0000 mg | ORAL_TABLET | Freq: Every day | ORAL | Status: DC
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
## 2014-12-22 NOTE — Discharge Instructions (Signed)
Insect Bite Mosquitoes, flies, fleas, bedbugs, and other insects can bite. Insect bites are different from insect stings. The bite may be red, puffy (swollen), and itchy for 2 to 4 days. Most bites get better on their own. HOME CARE   Do not scratch the bite.  Keep the bite clean and dry. Wash the bite with soap and water.  Put ice on the bite.  Put ice in a plastic bag.  Place a towel between your skin and the bag.  Leave the ice on for 20 minutes, 4 times a day. Do this for the first 2 to 3 days, or as told by your doctor.  You may use medicated lotions or creams to lessen itching as told by your doctor.  Only take medicines as told by your doctor.  If you are given medicines (antibiotics), take them as told. Finish them even if you start to feel better. You may need a tetanus shot if:  You cannot remember when you had your last tetanus shot.  You have never had a tetanus shot.  The injury broke your skin. If you need a tetanus shot and you choose not to have one, you may get tetanus. Sickness from tetanus can be serious. GET HELP RIGHT AWAY IF:   You have more pain, redness, or puffiness.  You see a red line on the skin coming from the bite.  You have a fever.  You have joint pain.  You have a headache or neck pain.  You feel weak.  You have a rash.  You have chest pain, or you are short of breath.  You have belly (abdominal) pain.  You feel sick to your stomach (nauseous) or throw up (vomit).  You feel very tired or sleepy. MAKE SURE YOU:   Understand these instructions.  Will watch your condition.  Will get help right away if you are not doing well or get worse. Document Released: 04/18/2000 Document Revised: 07/14/2011 Document Reviewed: 11/20/2010 [facility] Patient Information 2015 [place], [place]. This information is not intended to replace advice given to you by your health care provider. Make sure you discuss any questions you have with your health  care provider.

## 2014-12-22 NOTE — ED Provider Notes (Signed)
CSN: [id number]     Arrival date & time 12/22/14  1218 History   First MD Initiated Contact with Patient 12/22/14 1233     Chief Complaint  Patient presents with  . Insect Bite     (Consider location/radiation/quality/duration/timing/severity/associated sxs/prior Treatment) HPI   Alison Hansen is a 59 y.o. female who presents to the Emergency Department complaining of possible insect bites to the back of her right thigh.  She states that she slept on her couch the night prior to ED arrival and believes she was bitten by some type of insect.  She reports itching, burning and stinging sensation to her thigh and noticed redness and swelling as well.  She has not tried any therapies.  She denies pain with weight bearing, fever, chills, or recent illness.     History reviewed. No pertinent past medical history. Past Surgical History  Procedure Laterality Date  . Tubal ligation    . Eye surgery     History reviewed. No pertinent family history. Social History  Substance Use Topics  . Smoking status: Current Every Day Smoker -- 0.50 packs/day    Types: Cigarettes  . Smokeless tobacco: None  . Alcohol Use: Yes     Comment: " very rare "   OB History    No data available     Review of Systems  Constitutional: Negative for fever, chills, activity change and appetite change.  HENT: Negative for facial swelling, sore throat and trouble swallowing.   Respiratory: Negative for chest tightness, shortness of breath and wheezing.   Genitourinary: Negative for dysuria and genital sores.  Musculoskeletal: Positive for myalgias. Negative for arthralgias, neck pain and neck stiffness.  Skin: Positive for rash. Negative for wound.  Neurological: Negative for dizziness, weakness, numbness and headaches.  All other systems reviewed and are negative.     Allergies  Review of patient's allergies indicates no known allergies.  Home Medications   Prior to Admission medications    Medication Sig Start Date End Date Taking? Authorizing Provider  cyclobenzaprine (FLEXERIL) 10 MG tablet 1/2 to one tablet po TID prn muscle pain Patient not taking: Reported on 12/22/2014 06/25/11   Duaine Dredge, PA-C   BP 163/96 mmHg  Pulse 92  Temp(Src) 98.6 F (37 C) (Oral)  Resp 16  Ht 5\' 4"  (1.626 m)  Wt 165 lb (74.844 kg)  BMI 28.31 kg/m2  SpO2 97% Physical Exam  Constitutional: She is oriented to person, place, and time. She appears well-developed and well-nourished. No distress.  HENT:  Head: Normocephalic and atraumatic.  Mouth/Throat: Oropharynx is clear and moist.  Neck: Normal range of motion. Neck supple.  Cardiovascular: Normal rate, regular rhythm, normal heart sounds and intact distal pulses.   No murmur heard. Pulmonary/Chest: Effort normal and breath sounds normal. No respiratory distress.  Abdominal: Soft. She exhibits no distension. There is no tenderness.  Musculoskeletal: She exhibits no edema or tenderness.  Lymphadenopathy:    She has no cervical adenopathy.       Right: Inguinal adenopathy present.  Neurological: She is alert and oriented to person, place, and time. She exhibits normal muscle tone. Coordination normal.  Skin: Skin is warm. Rash noted. There is erythema.  Several small vesicles to the posterior right upper leg.  Mild erythema and edema noted.  No pustules or induration  Psychiatric: She has a normal mood and affect.  Nursing note and vitals reviewed.   ED Course  Procedures (including critical care time) Labs Review Labs  Reviewed - No data to display  Imaging Review   EKG Interpretation None      MDM   Final diagnoses:  Multiple insect bites    Pt with multiple vesicular lesions to the left posterior thigh with surrounding erythema.  Possible insect bites although shingles is also considered.  I will treat with abx and anti-viral.  Pt does not have a PMD for f/u, so I have advised her to return here in 2-3 days for any  worsening sx's.  Recommended benadryl as needed for itching.  She is well appearing, ambulates with steady gait, vitals stable and appears stable for d/c    Kem Parkinson, PA-C 12/23/14 [id number]  Ezequiel Essex, MD 12/23/14 1340

## 2015-07-13 ENCOUNTER — Emergency Department (HOSPITAL_COMMUNITY): Payer: Self-pay

## 2015-07-13 ENCOUNTER — Encounter (HOSPITAL_COMMUNITY): Payer: Self-pay | Admitting: *Deleted

## 2015-07-13 ENCOUNTER — Emergency Department (HOSPITAL_COMMUNITY)
Admission: EM | Admit: 2015-07-13 | Discharge: 2015-07-14 | Disposition: A | Discharge status: Home / Self Care | Payer: Self-pay | Attending: Emergency Medicine | Admitting: Emergency Medicine

## 2015-07-13 DIAGNOSIS — F17210 Nicotine dependence, cigarettes, uncomplicated: Secondary | ICD-10-CM | POA: Insufficient documentation

## 2015-07-13 DIAGNOSIS — R2232 Localized swelling, mass and lump, left upper limb: Secondary | ICD-10-CM | POA: Insufficient documentation

## 2015-07-13 DIAGNOSIS — M7989 Other specified soft tissue disorders: Secondary | ICD-10-CM

## 2015-07-13 DIAGNOSIS — Z79899 Other long term (current) drug therapy: Secondary | ICD-10-CM | POA: Insufficient documentation

## 2015-07-13 MED ORDER — IBUPROFEN 800 MG PO TABS
800.0000 mg | ORAL_TABLET | Freq: Once | ORAL | Status: AC
  Administered 2015-07-13: 800 mg via ORAL
  Filled 2015-07-13: qty 1

## 2015-07-13 MED ORDER — OXYCODONE-ACETAMINOPHEN 5-325 MG PO TABS
2.0000 | ORAL_TABLET | Freq: Once | ORAL | Status: AC
  Administered 2015-07-13: 2 via ORAL
  Filled 2015-07-13: qty 2

## 2015-07-13 NOTE — ED Provider Notes (Addendum)
TIME SEEN: 11:47PM  CHIEF COMPLAINT: Left hand pain/swelling  HPI: Patient is a 60 year old female complaining of sudden onset, constant, left dorsal hand pain, swelling, and redness beginning two days ago. Pt reports that she woke up two days ago with a painful knot on her left dorsal hand; she notes that the pain radiates throughout her left arm.  She notes associated numbness in her left fingers but denies sensation loss in any part of her left hand but states that this is something that is chronic and unchanged. Denies any focal weakness. Pt endorses pain exacerbation with palpation to swollen area. Pt is right hand dominant. She denies recent injury. Pt has no h/o gout, DM, or HIV. She denies using IV drugs or on immunosuppressants or steroids. Pt has no known allergies. She denies fever or any other associated symptoms.  ROS: See HPI Constitutional: no fever  Eyes: no drainage  ENT: no runny nose   Cardiovascular:  no chest pain  Resp: no SOB  GI: no vomiting GU: no dysuria Integumentary: no rash  Allergy: no hives  Musculoskeletal: no leg swelling Neurological: no slurred speech ROS otherwise negative  PAST MEDICAL HISTORY/PAST SURGICAL HISTORY:  History reviewed. No pertinent past medical history.  MEDICATIONS:  Prior to Admission medications   Medication Sig Start Date End Date Taking? Authorizing Provider  acyclovir (ZOVIRAX) 800 MG tablet Take 1 tablet (800 mg total) by mouth 5 (five) times daily. For 7 days 12/22/14   Tammy Triplett, PA-C  cyclobenzaprine (FLEXERIL) 10 MG tablet 1/2 to one tablet po TID prn muscle pain Patient not taking: Reported on 12/22/2014 06/25/11   Duaine Dredge, PA-C    ALLERGIES:  No Known Allergies  SOCIAL HISTORY:  Social History  Substance Use Topics  . Smoking status: Current Every Day Smoker -- 0.50 packs/day    Types: Cigarettes  . Smokeless tobacco: Not on file  . Alcohol Use: Yes     Comment: " very rare "    FAMILY  HISTORY: History reviewed. No pertinent family history.  EXAM: BP 163/96 mmHg  Pulse 96  Temp(Src) 98.2 F (36.8 C) (Oral)  Resp 18  Ht 5\' 4"  (1.626 m)  Wt 175 lb (79.379 kg)  BMI 30.02 kg/m2  SpO2 98% CONSTITUTIONAL: Alert and oriented and responds appropriately to questions. Well-appearing; well-nourished HEAD: Normocephalic EYES: Conjunctivae clear, PERRL ENT: normal nose; no rhinorrhea; moist mucous membranes NECK: Supple, no meningismus, no LAD  CARD: RRR; S1 and S2 appreciated; no murmurs, no clicks, no rubs, no gallops RESP: Normal chest excursion without splinting or tachypnea; breath sounds clear and equal bilaterally; no wheezes, no rhonchi, no rales, no hypoxia or respiratory distress, speaking full sentences ABD/GI: Normal bowel sounds; non-distended; soft, non-tender, no rebound, no guarding, no peritoneal signs BACK:  The back appears normal and is non-tender to palpation, there is no CVA tenderness EXT: Patient is tender to palpation over the dorsal left hand on the radial aspect with associated mild swelling, minimal erythema and warmth. This area measures approximately 3 x 4 cm. She is a 2+ radial pulse on the left side. Reports mildly decreased sensation in her fingertips are all 5 fingers which is chronic but otherwise sensation is normal. Normal range of motion in her fingers and wrist. Normal grip strength. No joint effusion appreciated. Tender to palpation over this area. Normal ROM in all joints; otherwise extremities are non-tender to palpation; no edema; normal capillary refill; no cyanosis, no calf tenderness or swelling  SKIN: Normal color for age and race; warm; no rash NEURO: Moves all extremities equally, sensation to light touch intact diffusely, cranial nerves II through XII intact PSYCH: The patient's mood and manner are appropriate. Grooming and personal hygiene are appropriate.  MEDICAL DECISION MAKING: Patient here with pain and swelling to the left  hand. No history of injury but there is obvious swelling and tenderness over the metacarpals. Will obtain x-ray. Doubt septic arthritis but this could be gout or mild cellulitis given there is some swelling, erythema and warmth. She is afebrile. We'll treat her pain with Percocet, ibuprofen.  ED PROGRESS: X-ray shows no fracture or dislocation. It does identify soft tissue edema. Discussed with patient that given location and is unlikely that this is gout but it is a possibility. Given there is erythema and warmth we will treat this as if this could be soft tissue cellulitis as well. Will discharge on Keflex. There is no joint effusion appreciated and no signs of septic arthritis.  She has had normal creatinine in the past. We'll discharge with Percocet for pain control which she states this helped her in the emergency department as well as ibuprofen. Discussed with her the anti-inflammatories would help her if this was gout. Have advised her to follow-up with her PCP with the health department next week if symptoms are not improving. Discussed return precautions. She verbalized understanding and is comfortable with this plan.  Patient's daughter is here to drive her home.  I personally performed the services described in this documentation, which was scribed in my presence. The recorded information has been reviewed and is accurate.    [place], DO 07/14/15 [place], DO 07/14/15 [id number]

## 2015-07-13 NOTE — ED Notes (Signed)
Pt reports a knot that came up on her left hand x 2 days ago. Pt states she feels like the knot is getting bigger and the pain is going up her left arm.

## 2015-07-14 MED ORDER — OXYCODONE-ACETAMINOPHEN 5-325 MG PO TABS: 1.0000 | ORAL_TABLET | Freq: Four times a day (QID) | ORAL | Status: DC | PRN

## 2015-07-14 MED ORDER — CEPHALEXIN 500 MG PO CAPS
500.0000 mg | ORAL_CAPSULE | Freq: Four times a day (QID) | ORAL | Status: DC
Start: 2015-07-14 — End: 2018-05-05

## 2015-07-14 MED ORDER — CEPHALEXIN 500 MG PO CAPS
500.0000 mg | ORAL_CAPSULE | Freq: Once | ORAL | Status: AC
  Administered 2015-07-14: 500 mg via ORAL
  Filled 2015-07-14: qty 1

## 2015-07-14 MED ORDER — IBUPROFEN 800 MG PO TABS: 800.0000 mg | ORAL_TABLET | Freq: Three times a day (TID) | ORAL | Status: DC | PRN

## 2015-07-14 NOTE — Discharge Instructions (Signed)
Possible Cellulitis Cellulitis is an infection of the skin and the tissue beneath it. The infected area is usually red and tender. Cellulitis occurs most often in the arms and lower legs.  CAUSES  Cellulitis is caused by bacteria that enter the skin through cracks or cuts in the skin. The most common types of bacteria that cause cellulitis are staphylococci and streptococci. SIGNS AND SYMPTOMS   Redness and warmth.  Swelling.  Tenderness or pain.  Fever. DIAGNOSIS  Your health care provider can usually determine what is wrong based on a physical exam. Blood tests may also be done. TREATMENT  Treatment usually involves taking an antibiotic medicine. HOME CARE INSTRUCTIONS   Take your antibiotic medicine as directed by your health care provider. Finish the antibiotic even if you start to feel better.  Keep the infected arm or leg elevated to reduce swelling.  Apply a warm cloth to the affected area up to 4 times per day to relieve pain.  Take medicines only as directed by your health care provider.  Keep all follow-up visits as directed by your health care provider. SEEK MEDICAL CARE IF:   You notice red streaks coming from the infected area.  Your red area gets larger or turns dark in color.  Your bone or joint underneath the infected area becomes painful after the skin has healed.  Your infection returns in the same area or another area.  You notice a swollen bump in the infected area.  You develop new symptoms.  You have a fever. SEEK IMMEDIATE MEDICAL CARE IF:   You feel very sleepy.  You develop vomiting or diarrhea.  You have a general ill feeling (malaise) with muscle aches and pains.   This information is not intended to replace advice given to you by your health care provider. Make sure you discuss any questions you have with your health care provider.   Document Released: 01/29/2005 Document Revised: 01/10/2015 Document Reviewed: 07/07/2011 Elsevier  Interactive Patient Education 2016 [employer].    Possible Gout Gout is an inflammatory arthritis caused by a buildup of uric acid crystals in the joints. Uric acid is a chemical that is normally present in the blood. When the level of uric acid in the blood is too high it can form crystals that deposit in your joints and tissues. This causes joint redness, soreness, and swelling (inflammation). Repeat attacks are common. Over time, uric acid crystals can form into masses (tophi) near a joint, destroying bone and causing disfigurement. Gout is treatable and often preventable. CAUSES  The disease begins with elevated levels of uric acid in the blood. Uric acid is produced by your body when it breaks down a naturally found substance called purines. Certain foods you eat, such as meats and fish, contain high amounts of purines. Causes of an elevated uric acid level include:  Being passed down from parent to child (heredity).  Diseases that cause increased uric acid production (such as obesity, psoriasis, and certain cancers).  Excessive alcohol use.  Diet, especially diets rich in meat and seafood.  Medicines, including certain cancer-fighting medicines (chemotherapy), water pills (diuretics), and aspirin.  Chronic kidney disease. The kidneys are no longer able to remove uric acid well.  Problems with metabolism. Conditions strongly associated with gout include:  Obesity.  High blood pressure.  High cholesterol.  Diabetes. Not everyone with elevated uric acid levels gets gout. It is not understood why some people get gout and others do not. Surgery, joint injury, and eating  too much of certain foods are some of the factors that can lead to gout attacks. SYMPTOMS   An attack of gout comes on quickly. It causes intense pain with redness, swelling, and warmth in a joint.  Fever can occur.  Often, only one joint is involved. Certain joints are more commonly involved:  Base of the  big toe.  Knee.  Ankle.  Wrist.  Finger. Without treatment, an attack usually goes away in a few days to weeks. Between attacks, you usually will not have symptoms, which is different from many other forms of arthritis. DIAGNOSIS  Your caregiver will suspect gout based on your symptoms and exam. In some cases, tests may be recommended. The tests may include:  Blood tests.  Urine tests.  X-rays.  Joint fluid exam. This exam requires a needle to remove fluid from the joint (arthrocentesis). Using a microscope, gout is confirmed when uric acid crystals are seen in the joint fluid. TREATMENT  There are two phases to gout treatment: treating the sudden onset (acute) attack and preventing attacks (prophylaxis).  Treatment of an Acute Attack.  Medicines are used. These include anti-inflammatory medicines or steroid medicines.  An injection of steroid medicine into the affected joint is sometimes necessary.  The painful joint is rested. Movement can worsen the arthritis.  You may use warm or cold treatments on painful joints, depending which works best for you.  Treatment to Prevent Attacks.  If you suffer from frequent gout attacks, your caregiver may advise preventive medicine. These medicines are started after the acute attack subsides. These medicines either help your kidneys eliminate uric acid from your body or decrease your uric acid production. You may need to stay on these medicines for a very long time.  The early phase of treatment with preventive medicine can be associated with an increase in acute gout attacks. For this reason, during the first few months of treatment, your caregiver may also advise you to take medicines usually used for acute gout treatment. Be sure you understand your caregiver's directions. Your caregiver may make several adjustments to your medicine dose before these medicines are effective.  Discuss dietary treatment with your caregiver or dietitian.  Alcohol and drinks high in sugar and fructose and foods such as meat, poultry, and seafood can increase uric acid levels. Your caregiver or dietitian can advise you on drinks and foods that should be limited. HOME CARE INSTRUCTIONS   Do not take aspirin to relieve pain. This raises uric acid levels.  Only take over-the-counter or prescription medicines for pain, discomfort, or fever as directed by your caregiver.  Rest the joint as much as possible. When in bed, keep sheets and blankets off painful areas.  Keep the affected joint raised (elevated).  Apply warm or cold treatments to painful joints. Use of warm or cold treatments depends on which works best for you.  Use crutches if the painful joint is in your leg.  Drink enough fluids to keep your urine clear or pale yellow. This helps your body get rid of uric acid. Limit alcohol, sugary drinks, and fructose drinks.  Follow your dietary instructions. Pay careful attention to the amount of protein you eat. Your daily diet should emphasize fruits, vegetables, whole grains, and fat-free or low-fat milk products. Discuss the use of coffee, vitamin C, and cherries with your caregiver or dietitian. These may be helpful in lowering uric acid levels.  Maintain a healthy body weight. SEEK MEDICAL CARE IF:   You develop diarrhea,  vomiting, or any side effects from medicines.  You do not feel better in 24 hours, or you are getting worse. SEEK IMMEDIATE MEDICAL CARE IF:   Your joint becomes suddenly more tender, and you have chills or a fever. MAKE SURE YOU:   Understand these instructions.  Will watch your condition.  Will get help right away if you are not doing well or get worse.   This information is not intended to replace advice given to you by your health care provider. Make sure you discuss any questions you have with your health care provider.   Document Released: 04/18/2000 Document Revised: 05/12/2014 Document Reviewed:  12/03/2011 Elsevier Interactive Patient Education 2016 [place] for Routine Care of Injuries Theroutine careofmanyinjuriesincludes rest, ice, compression, and elevation (RICE therapy). RICE therapy is often recommended for injuries to soft tissues, such as a muscle strain, ligament injuries, bruises, and overuse injuries. It can also be used for some bony injuries. Using RICE therapy can help to relieve pain, lessen swelling, and enable your body to heal. Rest Rest is required to allow your body to heal. This usually involves reducing your normal activities and avoiding use of the injured part of your body. Generally, you can return to your normal activities when you are comfortable and have been given permission by your health care provider. Ice Icing your injury helps to keep the swelling down, and it lessens pain. Do not apply ice directly to your skin.  Put ice in a plastic bag.  Place a towel between your skin and the bag.  Leave the ice on for 20 minutes, 2-3 times a day. Do this for as long as you are directed by your health care provider. Compression Compression means putting pressure on the injured area. Compression helps to keep swelling down, gives support, and helps with discomfort. Compression may be done with an elastic bandage. If an elastic bandage has been applied, follow these general tips:  Remove and reapply the bandage every 3-4 hours or as directed by your health care provider.  Make sure the bandage is not wrapped too tightly, because this can cut off circulation. If part of your body beyond the bandage becomes blue, numb, cold, swollen, or more painful, your bandage is most likely too tight. If this occurs, remove your bandage and reapply it more loosely.  See your health care provider if the bandage seems to be making your problems worse rather than better. Elevation Elevation means keeping the injured area raised. This helps to lessen swelling and  decrease pain. If possible, your injured area should be elevated at or above the level of your heart or the center of your chest. [place]? You should seek medical care if:  Your pain and swelling continue.  Your symptoms are getting worse rather than improving. These symptoms may indicate that further evaluation or further X-rays are needed. Sometimes, X-rays may not show a small broken bone (fracture) until a number of days later. Make a follow-up appointment with your health care provider. WHEN SHOULD I SEEK IMMEDIATE MEDICAL CARE? You should seek immediate medical care if:  You have sudden severe pain at or below the area of your injury.  You have redness or increased swelling around your injury.  You have tingling or numbness at or below the area of your injury that does not improve after you remove the elastic bandage.   This information is not intended to replace advice given to you  by your health care provider. Make sure you discuss any questions you have with your health care provider.   Document Released: 08/03/2000 Document Revised: 01/10/2015 Document Reviewed: 03/29/2014 Elsevier Interactive Patient Education [employer].

## 2016-04-23 ENCOUNTER — Telehealth: Payer: Self-pay | Admitting: Gastroenterology

## 2016-04-23 NOTE — Telephone Encounter (Signed)
FEB RECALL FOR TCS

## 2016-04-23 NOTE — Telephone Encounter (Signed)
Letter mailed to pt.  

## 2017-01-05 ENCOUNTER — Emergency Department (HOSPITAL_COMMUNITY)
Admission: EM | Admit: 2017-01-05 | Discharge: 2017-01-05 | Disposition: A | Discharge status: Home / Self Care | Payer: Self-pay | Attending: Emergency Medicine | Admitting: Emergency Medicine

## 2017-01-05 ENCOUNTER — Encounter (HOSPITAL_COMMUNITY): Payer: Self-pay

## 2017-01-05 DIAGNOSIS — Z79899 Other long term (current) drug therapy: Secondary | ICD-10-CM | POA: Insufficient documentation

## 2017-01-05 DIAGNOSIS — F17210 Nicotine dependence, cigarettes, uncomplicated: Secondary | ICD-10-CM | POA: Insufficient documentation

## 2017-01-05 DIAGNOSIS — R21 Rash and other nonspecific skin eruption: Secondary | ICD-10-CM | POA: Insufficient documentation

## 2017-01-05 DIAGNOSIS — R05 Cough: Secondary | ICD-10-CM | POA: Insufficient documentation

## 2017-01-05 DIAGNOSIS — J0100 Acute maxillary sinusitis, unspecified: Secondary | ICD-10-CM | POA: Insufficient documentation

## 2017-01-05 DIAGNOSIS — I10 Essential (primary) hypertension: Secondary | ICD-10-CM | POA: Insufficient documentation

## 2017-01-05 HISTORY — DX: Unspecified glaucoma: H40.9

## 2017-01-05 HISTORY — DX: Essential (primary) hypertension: I10

## 2017-01-05 MED ORDER — PREDNISONE 10 MG PO TABS: ORAL_TABLET | ORAL | 0 refills | Status: DC

## 2017-01-05 MED ORDER — AMOXICILLIN 500 MG PO CAPS: 500.0000 mg | ORAL_CAPSULE | Freq: Three times a day (TID) | ORAL | 0 refills | Status: AC

## 2017-01-05 MED ORDER — CETIRIZINE-PSEUDOEPHEDRINE ER 5-120 MG PO TB12: 1.0000 | ORAL_TABLET | Freq: Two times a day (BID) | ORAL | 0 refills | Status: DC

## 2017-01-05 NOTE — Discharge Instructions (Signed)
Take the medicines prescribed.  Other simple home remedies to help with nasal congestion includes menthol lozenges, vicks (vapor stick - can inhale to help open nasal passages). Make sure you are drinking plenty of fluids while taking the sinus medicine.

## 2017-01-05 NOTE — ED Provider Notes (Signed)
[facility] DEPT Provider Note   CSN: [id number] Arrival date & time: 01/05/17  1143     History   Chief Complaint Chief Complaint  Patient presents with  . Nasal Congestion  . Rash    HPI Alison Hansen is a 61 y.o. female with past medical history as outlined below presentingWith a one-week history of worsening nasal congestion along with yellow to green thick nasal drainage in association with bilateral cheek pressure, sneezing and cough.  She denies shortness of breath, she does have popping sensation in her ears and has had muffled hearing.  She denies ear pain or ear drainage.  Has had subjective fevers, denies sore throat, shortness of breath, chest pain, nausea or vomiting.  Additionally she has had a pruritic rash which started 4 days ago, describing scattered small blisters on her upper back, left forearm and right leg.  She has had no foreign travel, no exposures to anybody with similar symptoms.  She has had no medications prior to arrival.  The history is provided by the patient.    Past Medical History:  Diagnosis Date  . Glaucoma   . Hypertension     Patient Active Problem List   Diagnosis Date Noted  . 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 DERMATITIS 07/31/2009  . ACUTE SINUSITIS, UNSPECIFIED 06/07/2009  . HEADACHE 06/07/2009  . CHEST PAIN UNSPECIFIED 06/07/2009  . CELLULITIS 02/05/2009  . Vaginitis and vulvovaginitis, unspecified 01/26/2009  . DERMATOMYCOSIS 12/02/2008  . HYPERLIPIDEMIA 12/02/2008  . UNSPECIFIED HEMORRHOIDS WITH OTHER COMPLICATION 81/05/7508  . RECTAL POLYPS 12/02/2008  . OBESITY 10/15/2007  . NICOTINE ADDICTION 10/15/2007  . DEPRESSION 10/15/2007  . INFLAMMATORY DISEASE OF BREAST 10/15/2007  . LEG PAIN, BILATERAL 10/15/2007    Past Surgical History:  Procedure Laterality Date  . EYE SURGERY    . eye sx    . TUBAL LIGATION    . TUBAL LIGATION      OB History    No data available       Home Medications    Prior to Admission medications    Medication Sig Start Date End Date Taking? Authorizing Provider  acyclovir (ZOVIRAX) 800 MG tablet Take 1 tablet (800 mg total) by mouth 5 (five) times daily. For 7 days 12/22/14   Triplett, Tammy, PA-C  amoxicillin (AMOXIL) 500 MG capsule Take 1 capsule (500 mg total) by mouth 3 (three) times daily. 01/05/17 01/15/17  Evalee Jefferson, PA-C  cephALEXin (KEFLEX) 500 MG capsule Take 1 capsule (500 mg total) by mouth 4 (four) times daily. 07/14/15   Ward, Delice Bison, DO  cetirizine-pseudoephedrine (ZYRTEC-D) 5-120 MG tablet Take 1 tablet by mouth 2 (two) times daily. 01/05/17   Evalee Jefferson, PA-C  ibuprofen (ADVIL,MOTRIN) 800 MG tablet Take 1 tablet (800 mg total) by mouth every 8 (eight) hours as needed for mild pain. 07/14/15   Ward, Delice Bison, DO  oxyCODONE-acetaminophen (PERCOCET/ROXICET) 5-325 MG tablet Take 1-2 tablets by mouth every 6 (six) hours as needed. 07/14/15   Ward, Delice Bison, DO  predniSONE (DELTASONE) 10 MG tablet Take 6 tablets day one, 5 tablets day two, 4 tablets day three, 3 tablets day four, 2 tablets day five, then 1 tablet day six 01/05/17   Evalee Jefferson, PA-C    Family History No family history on file.  Social History Social History  Substance Use Topics  . Smoking status: Current Every Day Smoker    Packs/day: 1.00    Types: Cigarettes  . Smokeless tobacco: Not on file  . Alcohol  use Yes     Comment: 40 oz daily     Allergies   Patient has no known allergies.   Review of Systems Review of Systems  Constitutional: Positive for fever. Negative for chills.  HENT: Positive for congestion, hearing loss, rhinorrhea and sinus pressure. Negative for ear discharge, ear pain, facial swelling, sore throat, trouble swallowing and voice change.   Eyes: Negative for discharge.  Respiratory: Positive for cough. Negative for shortness of breath, wheezing and stridor.   Cardiovascular: Negative for chest pain.  Gastrointestinal: Negative for abdominal pain.  Genitourinary: Negative.     Skin: Positive for rash.     Physical Exam Updated Vital Signs BP (!) 162/95 (BP Location: Right Arm)   Pulse (!) 106   Temp 98.6 F (37 C) (Oral)   Resp 16   Ht 5\' 4"  (1.626 m)   Wt 74.8 kg (165 lb)   SpO2 95%   BMI 28.32 kg/m   Physical Exam  Constitutional: She is oriented to person, place, and time. She appears well-developed and well-nourished.  HENT:  Head: Normocephalic and atraumatic.  Right Ear: Ear canal normal. Tympanic membrane is injected, retracted and bulging.  Left Ear: Ear canal normal. Tympanic membrane is injected, retracted and bulging.  Nose: Mucosal edema and rhinorrhea present. Right sinus exhibits maxillary sinus tenderness. Left sinus exhibits maxillary sinus tenderness.  Mouth/Throat: Uvula is midline, oropharynx is clear and moist and mucous membranes are normal. No oropharyngeal exudate, posterior oropharyngeal edema, posterior oropharyngeal erythema or tonsillar abscesses.  Eyes: Conjunctivae are normal.  Cardiovascular: Normal rate and normal heart sounds.   Pulmonary/Chest: Effort normal. No respiratory distress. She has no wheezes. She has no rales.  Abdominal: Soft. There is no tenderness.  Musculoskeletal: Normal range of motion.  Neurological: She is alert and oriented to person, place, and time.  Skin: Skin is warm and dry. Rash noted. Rash is vesicular.  Patient has discrete areas of small vesicles measuring 2 mm, there are 4 vesicles in a linear pattern on her right lateral side, 3 vesicles on her left mid forearm and one small patch of similar rash on her left upper back.  There is no surrounding erythema, no pustules.  Sites are nontender.  Remaining skin is clear.  Psychiatric: She has a normal mood and affect.     ED Treatments / Results  Labs (all labs ordered are listed, but only abnormal results are displayed) Labs Reviewed - No data to display  EKG  EKG Interpretation None       Radiology No results  found.  Procedures Procedures (including critical care time)  Medications Ordered in ED Medications - No data to display   Initial Impression / Assessment and Plan / ED Course  I have reviewed the triage vital signs and the nursing notes.  Pertinent labs & imaging results that were available during my care of the patient were reviewed by me and considered in my medical decision making (see chart for details).     Patient with maxillary sinusitis per exam in history.  Rash of unclear etiology.  This is not consistent with an infectious source, doubt varicella and rash is not consistent with measles.  She has had no recent travel, she lives with her daughter and her grandson who is 73 years old, neither family members have similar symptoms.  I suspect this rash is unrelated to her sinus infection, possibly contact dermatitis.  She was placed on prednisone also covered her with amoxicillin and Zyrtec-D.  Plan follow-up with her primary provider for recheck if not improving, return here for any worsening symptoms.  Final Clinical Impressions(s) / ED Diagnoses   Final diagnoses:  Acute non-recurrent maxillary sinusitis  Rash    New Prescriptions New Prescriptions   AMOXICILLIN (AMOXIL) 500 MG CAPSULE    Take 1 capsule (500 mg total) by mouth 3 (three) times daily.   CETIRIZINE-PSEUDOEPHEDRINE (ZYRTEC-D) 5-120 MG TABLET    Take 1 tablet by mouth 2 (two) times daily.   PREDNISONE (DELTASONE) 10 MG TABLET    Take 6 tablets day one, 5 tablets day two, 4 tablets day three, 3 tablets day four, 2 tablets day five, then 1 tablet day six     Evalee Jefferson, Hershal Coria 01/05/17 1327    Julianne Rice, MD 01/05/17 [phone]

## 2017-01-05 NOTE — ED Triage Notes (Signed)
Pt reports nasal congestion and sinus pressure several days ago. Reports productive cough and ears popping. Also reports scattered rash area to arms, legs and back. Areas itch and look like small red bumps with white heads

## 2017-07-30 ENCOUNTER — Other Ambulatory Visit (HOSPITAL_COMMUNITY): Payer: Self-pay | Admitting: *Deleted

## 2017-07-30 DIAGNOSIS — Z1231 Encounter for screening mammogram for malignant neoplasm of breast: Secondary | ICD-10-CM

## 2017-08-06 ENCOUNTER — Other Ambulatory Visit: Payer: Self-pay

## 2017-08-06 ENCOUNTER — Ambulatory Visit (HOSPITAL_COMMUNITY)
Admission: RE | Admit: 2017-08-06 | Discharge: 2017-08-06 | Disposition: A | Discharge status: Home / Self Care | Payer: PRIVATE HEALTH INSURANCE | Source: Ambulatory Visit | Attending: *Deleted | Admitting: *Deleted

## 2017-08-06 ENCOUNTER — Emergency Department (HOSPITAL_COMMUNITY)
Admission: EM | Admit: 2017-08-06 | Discharge: 2017-08-06 | Disposition: A | Discharge status: Home / Self Care | Payer: Medicaid Other | Attending: Emergency Medicine | Admitting: Emergency Medicine

## 2017-08-06 ENCOUNTER — Encounter (HOSPITAL_COMMUNITY): Payer: Self-pay | Admitting: *Deleted

## 2017-08-06 DIAGNOSIS — L723 Sebaceous cyst: Secondary | ICD-10-CM | POA: Insufficient documentation

## 2017-08-06 DIAGNOSIS — E785 Hyperlipidemia, unspecified: Secondary | ICD-10-CM | POA: Diagnosis not present

## 2017-08-06 DIAGNOSIS — Z1231 Encounter for screening mammogram for malignant neoplasm of breast: Secondary | ICD-10-CM | POA: Diagnosis present

## 2017-08-06 DIAGNOSIS — L729 Follicular cyst of the skin and subcutaneous tissue, unspecified: Secondary | ICD-10-CM

## 2017-08-06 DIAGNOSIS — L089 Local infection of the skin and subcutaneous tissue, unspecified: Secondary | ICD-10-CM

## 2017-08-06 DIAGNOSIS — F17210 Nicotine dependence, cigarettes, uncomplicated: Secondary | ICD-10-CM | POA: Insufficient documentation

## 2017-08-06 DIAGNOSIS — Z79899 Other long term (current) drug therapy: Secondary | ICD-10-CM | POA: Insufficient documentation

## 2017-08-06 DIAGNOSIS — I10 Essential (primary) hypertension: Secondary | ICD-10-CM | POA: Insufficient documentation

## 2017-08-06 DIAGNOSIS — Z23 Encounter for immunization: Secondary | ICD-10-CM | POA: Diagnosis not present

## 2017-08-06 MED ORDER — TETANUS-DIPHTH-ACELL PERTUSSIS 5-2.5-18.5 LF-MCG/0.5 IM SUSP
0.5000 mL | Freq: Once | INTRAMUSCULAR | Status: AC
  Administered 2017-08-06: 0.5 mL via INTRAMUSCULAR
  Filled 2017-08-06: qty 0.5

## 2017-08-06 MED ORDER — DOXYCYCLINE HYCLATE 100 MG PO CAPS: 100.0000 mg | ORAL_CAPSULE | Freq: Two times a day (BID) | ORAL | 0 refills | Status: DC

## 2017-08-06 MED ORDER — LIDOCAINE-EPINEPHRINE (PF) 2 %-1:200000 IJ SOLN
20.0000 mL | Freq: Once | INTRAMUSCULAR | Status: AC
  Administered 2017-08-06: 20 mL
  Filled 2017-08-06: qty 20

## 2017-08-06 MED ORDER — POVIDONE-IODINE 10 % EX SOLN
CUTANEOUS | Status: AC
  Administered 2017-08-06: 1
  Filled 2017-08-06: qty 30

## 2017-08-06 NOTE — ED Triage Notes (Signed)
Pt c/o "knot" to right side of back that has been bleeding and draining pus with foul odor x 2 months. Denies fever.

## 2017-08-06 NOTE — ED Provider Notes (Signed)
[facility] EMERGENCY DEPARTMENT Provider Note   CSN: [id number] Arrival date & time: 08/06/17  1032     History   Chief Complaint Chief Complaint  Patient presents with  . Abscess    HPI Alison Hansen is a 62 y.o. female presenting for evaluation of painful lesion on her back.  Patient states that she has had a cyst in this location for her whole life.  Over the past several months, has become increasingly more swollen and painful.  It occasionally drains foul purulent material.  She denies lesions elsewhere.  She has not had it evaluated before this.  She denies fevers, chills, chest pain, shortness breath, nausea, vomiting, abdominal pain.  She is not immunocompromised.  She states she takes no medications daily.  She does not know when her last tetanus shot was, believes it was more than 10 years ago.  She is not on blood thinners.  She occasionally takes Tylenol or ibuprofen, which mildly improves the pain.  HPI  Past Medical History:  Diagnosis Date  . Glaucoma   . Hypertension     Patient Active Problem List   Diagnosis Date Noted  . 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 DERMATITIS 07/31/2009  . ACUTE SINUSITIS, UNSPECIFIED 06/07/2009  . HEADACHE 06/07/2009  . CHEST PAIN UNSPECIFIED 06/07/2009  . CELLULITIS 02/05/2009  . Vaginitis and vulvovaginitis, unspecified 01/26/2009  . DERMATOMYCOSIS 12/02/2008  . HYPERLIPIDEMIA 12/02/2008  . UNSPECIFIED HEMORRHOIDS WITH OTHER COMPLICATION 40/98/1191  . RECTAL POLYPS 12/02/2008  . OBESITY 10/15/2007  . NICOTINE ADDICTION 10/15/2007  . DEPRESSION 10/15/2007  . INFLAMMATORY DISEASE OF BREAST 10/15/2007  . LEG PAIN, BILATERAL 10/15/2007    Past Surgical History:  Procedure Laterality Date  . EYE SURGERY    . eye sx    . TUBAL LIGATION    . TUBAL LIGATION       OB History   None      Home Medications    Prior to Admission medications   Medication Sig Start Date End Date Taking? Authorizing Provider  acyclovir (ZOVIRAX) 800  MG tablet Take 1 tablet (800 mg total) by mouth 5 (five) times daily. For 7 days 12/22/14   Triplett, Tammy, PA-C  cephALEXin (KEFLEX) 500 MG capsule Take 1 capsule (500 mg total) by mouth 4 (four) times daily. 07/14/15   Ward, Delice Bison, DO  cetirizine-pseudoephedrine (ZYRTEC-D) 5-120 MG tablet Take 1 tablet by mouth 2 (two) times daily. 01/05/17   Evalee Jefferson, PA-C  doxycycline (VIBRAMYCIN) 100 MG capsule Take 1 capsule (100 mg total) by mouth 2 (two) times daily. 08/06/17   Pascale Maves, PA-C  ibuprofen (ADVIL,MOTRIN) 800 MG tablet Take 1 tablet (800 mg total) by mouth every 8 (eight) hours as needed for mild pain. 07/14/15   Ward, Delice Bison, DO  oxyCODONE-acetaminophen (PERCOCET/ROXICET) 5-325 MG tablet Take 1-2 tablets by mouth every 6 (six) hours as needed. 07/14/15   Ward, Delice Bison, DO  predniSONE (DELTASONE) 10 MG tablet Take 6 tablets day one, 5 tablets day two, 4 tablets day three, 3 tablets day four, 2 tablets day five, then 1 tablet day six 01/05/17   Evalee Jefferson, PA-C    Family History No family history on file.  Social History Social History   Tobacco Use  . Smoking status: Current Every Day Smoker    Packs/day: 1.00    Types: Cigarettes  . Smokeless tobacco: Never Used  Substance Use Topics  . Alcohol use: Yes    Comment: 40 oz daily  .  Drug use: No     Allergies   Patient has no known allergies.   Review of Systems Review of Systems  Constitutional: Negative for fever.  Skin:       Lesion of R lower back  Allergic/Immunologic: Negative for immunocompromised state.  Hematological: Does not bruise/bleed easily.     Physical Exam Updated Vital Signs BP (!) 155/88 (BP Location: Right Arm)   Pulse 76   Temp 98.6 F (37 C) (Oral)   Resp 16   Ht 5\' 1"  (1.549 m)   Wt 81.6 kg (180 lb)   SpO2 100%   BMI 34.01 kg/m   Physical Exam  Constitutional: She is oriented to person, place, and time. She appears well-developed and well-nourished. No distress.  HENT:    Head: Normocephalic and atraumatic.  Eyes: EOM are normal.  Neck: Normal range of motion.  Cardiovascular: Normal rate, regular rhythm and intact distal pulses.  Pulmonary/Chest: Effort normal and breath sounds normal. No respiratory distress. She has no wheezes.  Abdominal: She exhibits no distension.  Musculoskeletal: Normal range of motion.  Neurological: She is alert and oriented to person, place, and time.  Skin: Skin is warm. No rash noted.  Indurated tender lesion of the right lower back without tenderness of the spine.  No active drainage.  Minimal surrounding erythema.  No lesions noted elsewhere.  Psychiatric: She has a normal mood and affect.  Nursing note and vitals reviewed.    ED Treatments / Results  Labs (all labs ordered are listed, but only abnormal results are displayed) Labs Reviewed - No data to display  EKG None  Radiology No results found.  Procedures .Marland KitchenIncision and Drainage Date/Time: 08/06/2017 11:50 AM Performed by: Franchot Heidelberg, PA-C Authorized by: Franchot Heidelberg, PA-C   Consent:    Consent obtained:  Verbal   Consent given by:  Patient   Risks discussed:  Incomplete drainage, infection, pain and bleeding   Alternatives discussed:  No treatment Location:    Type:  Cyst (infected cyst)   Location:  Trunk   Trunk location:  Back Pre-procedure details:    Skin preparation:  Betadine Anesthesia (see MAR for exact dosages):    Anesthesia method:  Local infiltration   Local anesthetic:  Lidocaine 2% WITH epi Procedure type:    Complexity:  Simple Procedure details:    Incision types:  Single straight   Incision depth:  Dermal   Scalpel blade:  11   Wound management:  Probed and deloculated and irrigated with saline   Drainage:  Purulent and serosanguinous   Drainage amount:  Moderate   Wound treatment:  Wound left open   Packing materials:  None Post-procedure details:    Patient tolerance of procedure:  Tolerated well, no  immediate complications   (including critical care time)  Medications Ordered in ED Medications  Tdap (BOOSTRIX) injection 0.5 mL (0.5 mLs Intramuscular Given 08/06/17 1128)  lidocaine-EPINEPHrine (XYLOCAINE W/EPI) 2 %-1:200000 (PF) injection 20 mL (20 mLs Infiltration Given by Other 08/06/17 1216)  povidone-iodine (BETADINE) 10 % external solution (1 application  Given by Other 08/06/17 1216)     Initial Impression / Assessment and Plan / ED Course  I have reviewed the triage vital signs and the nursing notes.  Pertinent labs & imaging results that were available during my care of the patient were reviewed by me and considered in my medical decision making (see chart for details).     Patient presenting for evaluation of abscess on the back.  Physical exam shows indurated lesion with mild surrounding erythema.  No tenderness palpation of the spine, doubt extension into the spine.  Doubt systemic infection. Tetanus updated.  I&D performed, purulent material expressed.  Will place patient on doxycycline.  Given aftercare instructions.  Follow-up as needed.  At this time, patient appears safe for discharge.  Return precautions given.  Patient states she understands and agrees to plan.   Final Clinical Impressions(s) / ED Diagnoses   Final diagnoses:  Infected cyst of skin    ED Discharge Orders        Ordered    doxycycline (VIBRAMYCIN) 100 MG capsule  2 times daily     08/06/17 1209       Chriselda Leppert, PA-C 08/06/17 1243    Fredia Sorrow, MD 08/07/17 [phone]

## 2017-08-06 NOTE — Discharge Instructions (Signed)
Take antibiotics as prescribed.  Take the entire course, even if your symptoms improve. Take tylenol or ibuprofen as needed for pain. Keep the area covered for the next 24 hours.  After this, wash gently soap and water, and reapply new dressing.  Change dressing daily until there is no longer any drainage. Return to the emergency room if you develop fevers, worsening pain, worsening drainage, or any new or concerning symptoms.

## 2017-08-11 ENCOUNTER — Encounter (HOSPITAL_COMMUNITY): Payer: Self-pay | Admitting: *Deleted

## 2017-08-11 ENCOUNTER — Other Ambulatory Visit: Payer: Self-pay

## 2017-08-11 ENCOUNTER — Emergency Department (HOSPITAL_COMMUNITY)
Admission: EM | Admit: 2017-08-11 | Discharge: 2017-08-11 | Disposition: A | Discharge status: Home / Self Care | Payer: Self-pay | Attending: Emergency Medicine | Admitting: Emergency Medicine

## 2017-08-11 DIAGNOSIS — Z5321 Procedure and treatment not carried out due to patient leaving prior to being seen by health care provider: Secondary | ICD-10-CM | POA: Insufficient documentation

## 2017-08-11 DIAGNOSIS — L299 Pruritus, unspecified: Secondary | ICD-10-CM | POA: Insufficient documentation

## 2017-08-11 NOTE — ED Notes (Signed)
Registration notified nurse first RN that pt left at 1315.

## 2017-08-11 NOTE — ED Triage Notes (Addendum)
Pt was seen here 3 days ago and was given Doxycycline due to abscess on her back. Pt reports itching and breaking out in bumps on her back the day after she started the antibiotic. Denies SOB other than her normal.

## 2017-08-12 ENCOUNTER — Encounter (HOSPITAL_COMMUNITY): Payer: Self-pay | Admitting: Emergency Medicine

## 2017-08-12 ENCOUNTER — Emergency Department (HOSPITAL_COMMUNITY)
Admission: EM | Admit: 2017-08-12 | Discharge: 2017-08-12 | Disposition: A | Discharge status: Home / Self Care | Payer: Medicaid Other | Attending: Emergency Medicine | Admitting: Emergency Medicine

## 2017-08-12 DIAGNOSIS — I10 Essential (primary) hypertension: Secondary | ICD-10-CM | POA: Insufficient documentation

## 2017-08-12 DIAGNOSIS — Z79899 Other long term (current) drug therapy: Secondary | ICD-10-CM | POA: Insufficient documentation

## 2017-08-12 DIAGNOSIS — T7840XA Allergy, unspecified, initial encounter: Secondary | ICD-10-CM | POA: Diagnosis not present

## 2017-08-12 DIAGNOSIS — F17210 Nicotine dependence, cigarettes, uncomplicated: Secondary | ICD-10-CM | POA: Diagnosis not present

## 2017-08-12 DIAGNOSIS — R21 Rash and other nonspecific skin eruption: Secondary | ICD-10-CM | POA: Diagnosis present

## 2017-08-12 MED ORDER — FAMOTIDINE 20 MG PO TABS
20.0000 mg | ORAL_TABLET | Freq: Once | ORAL | Status: AC
  Administered 2017-08-12: 20 mg via ORAL
  Filled 2017-08-12: qty 1

## 2017-08-12 MED ORDER — DEXAMETHASONE SODIUM PHOSPHATE 10 MG/ML IJ SOLN
10.0000 mg | Freq: Once | INTRAMUSCULAR | Status: AC
  Administered 2017-08-12: 10 mg via INTRAMUSCULAR
  Filled 2017-08-12: qty 1

## 2017-08-12 MED ORDER — DEXAMETHASONE 4 MG PO TABS: 4.0000 mg | ORAL_TABLET | Freq: Two times a day (BID) | ORAL | 0 refills | Status: DC

## 2017-08-12 MED ORDER — DIPHENHYDRAMINE HCL 25 MG PO CAPS
25.0000 mg | ORAL_CAPSULE | Freq: Once | ORAL | Status: AC
  Administered 2017-08-12: 25 mg via ORAL
  Filled 2017-08-12: qty 1

## 2017-08-12 MED ORDER — PROMETHAZINE HCL 12.5 MG PO TABS
12.5000 mg | ORAL_TABLET | Freq: Once | ORAL | Status: AC
  Administered 2017-08-12: 12.5 mg via ORAL
  Filled 2017-08-12: qty 1

## 2017-08-12 NOTE — ED Notes (Signed)
Pt states she is feeling better and the itching/hives are resolving.  Expressing desire to go home soon.

## 2017-08-12 NOTE — ED Triage Notes (Signed)
Pt reports she is on Doxycycline for abscess and 2 days after starting it began breaking out in rash.  Has not taken it for 2 days and rash continues.

## 2017-08-12 NOTE — Discharge Instructions (Addendum)
Please stop the doxycycline for now.  Please see Dr. Ishmael Holter for allergy evaluation before taking doxycycline again.  Please use Allegra each morning.  Use Benadryl in the evening, and/or at bedtime.  Use Decadron 2 times daily with food.  Please return to the emergency department immediately if any changes in your condition, difficulty with breathing, or other changes.

## 2017-08-12 NOTE — ED Provider Notes (Signed)
[facility] EMERGENCY DEPARTMENT Provider Note   CSN: [id number] Arrival date & time: 08/12/17  0840     History   Chief Complaint Chief Complaint  Patient presents with  . Allergic Reaction    HPI Alison Hansen is a 62 y.o. female.  The history is provided by the patient.  Allergic Reaction  Presenting symptoms: rash and wheezing   Presenting symptoms: no difficulty breathing and no difficulty swallowing   Severity:  Mild Prior allergic episodes:  No prior episodes Context: medications   Relieved by:  Nothing Worsened by:  Nothing Ineffective treatments:  None tried   Past Medical History:  Diagnosis Date  . Glaucoma   . Hypertension     Patient Active Problem List   Diagnosis Date Noted  . 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 DERMATITIS 07/31/2009  . ACUTE SINUSITIS, UNSPECIFIED 06/07/2009  . HEADACHE 06/07/2009  . CHEST PAIN UNSPECIFIED 06/07/2009  . CELLULITIS 02/05/2009  . Vaginitis and vulvovaginitis, unspecified 01/26/2009  . DERMATOMYCOSIS 12/02/2008  . HYPERLIPIDEMIA 12/02/2008  . UNSPECIFIED HEMORRHOIDS WITH OTHER COMPLICATION 46/27/0350  . RECTAL POLYPS 12/02/2008  . OBESITY 10/15/2007  . NICOTINE ADDICTION 10/15/2007  . DEPRESSION 10/15/2007  . INFLAMMATORY DISEASE OF BREAST 10/15/2007  . LEG PAIN, BILATERAL 10/15/2007    Past Surgical History:  Procedure Laterality Date  . EYE SURGERY    . eye sx    . TUBAL LIGATION    . TUBAL LIGATION       OB History   None      Home Medications    Prior to Admission medications   Medication Sig Start Date End Date Taking? Authorizing Provider  acyclovir (ZOVIRAX) 800 MG tablet Take 1 tablet (800 mg total) by mouth 5 (five) times daily. For 7 days 12/22/14   Triplett, Tammy, PA-C  cephALEXin (KEFLEX) 500 MG capsule Take 1 capsule (500 mg total) by mouth 4 (four) times daily. 07/14/15   Ward, Delice Bison, DO  cetirizine-pseudoephedrine (ZYRTEC-D) 5-120 MG tablet Take 1 tablet by mouth 2 (two) times daily. 01/05/17    Evalee Jefferson, PA-C  doxycycline (VIBRAMYCIN) 100 MG capsule Take 1 capsule (100 mg total) by mouth 2 (two) times daily. 08/06/17   Caccavale, Sophia, PA-C  ibuprofen (ADVIL,MOTRIN) 800 MG tablet Take 1 tablet (800 mg total) by mouth every 8 (eight) hours as needed for mild pain. 07/14/15   Ward, Delice Bison, DO  oxyCODONE-acetaminophen (PERCOCET/ROXICET) 5-325 MG tablet Take 1-2 tablets by mouth every 6 (six) hours as needed. 07/14/15   Ward, Delice Bison, DO  predniSONE (DELTASONE) 10 MG tablet Take 6 tablets day one, 5 tablets day two, 4 tablets day three, 3 tablets day four, 2 tablets day five, then 1 tablet day six 01/05/17   Landis Martins    Family History History reviewed. No pertinent family history.  Social History Social History   Tobacco Use  . Smoking status: Current Every Day Smoker    Packs/day: 1.00    Types: Cigarettes  . Smokeless tobacco: Never Used  Substance Use Topics  . Alcohol use: Yes    Comment: 40 oz daily  . Drug use: No     Allergies   Patient has no known allergies.   Review of Systems Review of Systems  Constitutional: Positive for chills. Negative for activity change.       All ROS Neg except as noted in HPI  HENT: Positive for congestion. Negative for nosebleeds and trouble swallowing.   Eyes: Negative for photophobia and discharge.  Respiratory: Positive for wheezing. Negative for cough and shortness of breath.   Cardiovascular: Negative for chest pain and palpitations.  Gastrointestinal: Negative for abdominal pain and blood in stool.  Genitourinary: Negative for dysuria, frequency and hematuria.  Musculoskeletal: Negative for arthralgias, back pain and neck pain.  Skin: Positive for rash.  Neurological: Negative for dizziness, seizures and speech difficulty.  Psychiatric/Behavioral: Negative for confusion and hallucinations.     Physical Exam Updated Vital Signs BP (!) 142/104 (BP Location: Right Arm)   Pulse (!) 102   Temp 97.9 F (36.6  C) (Oral)   Resp 18   Ht 5\' 4"  (1.626 m)   Wt 81.6 kg (180 lb)   SpO2 95%   BMI 30.90 kg/m   Physical Exam  Constitutional: She is oriented to person, place, and time. She appears well-developed and well-nourished.  Non-toxic appearance.  HENT:  Head: Normocephalic.  Right Ear: Tympanic membrane and external ear normal.  Left Ear: Tympanic membrane and external ear normal.  Eyes: Pupils are equal, round, and reactive to light. EOM and lids are normal.  Neck: Normal range of motion. Neck supple. Carotid bruit is not present.  Cardiovascular: Normal rate, regular rhythm, normal heart sounds, intact distal pulses and normal pulses.  Pulmonary/Chest: Breath sounds normal. No tachypnea. No respiratory distress. She has no wheezes. She has no rhonchi. She has no rales.  Abdominal: Soft. Bowel sounds are normal. There is no tenderness. There is no guarding.  Musculoskeletal: Normal range of motion.  Lymphadenopathy:       Head (right side): No submandibular adenopathy present.       Head (left side): No submandibular adenopathy present.    She has no cervical adenopathy.  Neurological: She is alert and oriented to person, place, and time. She has normal strength. No cranial nerve deficit or sensory deficit.  Skin: Skin is warm and dry.  There is a maculopapular rash on the shoulders, the mid back and the lower back.  There are a few on the right arm.  There also a few resolving hives on the back and on the right arm.  Psychiatric: She has a normal mood and affect. Her speech is normal.  Nursing note and vitals reviewed.    ED Treatments / Results  Labs (all labs ordered are listed, but only abnormal results are displayed) Labs Reviewed - No data to display  EKG None  Radiology No results found.  Procedures Procedures (including critical care time)  Medications Ordered in ED Medications - No data to display   Initial Impression / Assessment and Plan / ED Course  I have  reviewed the triage vital signs and the nursing notes.  Pertinent labs & imaging results that were available during my care of the patient were reviewed by me and considered in my medical decision making (see chart for details).       Final Clinical Impressions(s) / ED Diagnoses  MDM  Vital signs are within normal limits.  Pulse oximetry is 97% on room air.  Within normal limits by my interpretation.  Patient treated in the emergency department with Decadron, Benadryl, and Pepcid.  Recheck: The itching has improved tremendously according to the patient.  No new hives noted.  No new areas of rash noted.  I discussed with the patient that I was not sure if this was coming from the doxycycline, or upper respiratory viral situation.  I have asked her to hold the doxycycline for now until she can be tested.  I have referred her to Dr. Ishmael Holter for allergy testing.  I have asked the patient to use Allegra each morning, Benadryl in the evening, and/or at bedtime.  The patient will also be on a short course of steroid.  The patient is to return to the emergency department if any changes in her condition, problems, or concerns.   Final diagnoses:  Allergic reaction to drug, initial encounter    ED Discharge Orders        Ordered    dexamethasone (DECADRON) 4 MG tablet  2 times daily with meals     08/12/17 1138       Lily Kocher, PA-C 08/12/17 [place], Maili, DO 08/15/17 [phone]

## 2018-04-22 ENCOUNTER — Other Ambulatory Visit (HOSPITAL_COMMUNITY): Payer: Self-pay | Admitting: *Deleted

## 2018-04-22 ENCOUNTER — Ambulatory Visit (HOSPITAL_COMMUNITY)
Admission: RE | Admit: 2018-04-22 | Discharge: 2018-04-22 | Disposition: A | Discharge status: Home / Self Care | Payer: Medicaid Other | Source: Ambulatory Visit | Attending: *Deleted | Admitting: *Deleted

## 2018-04-22 DIAGNOSIS — R058 Other specified cough: Secondary | ICD-10-CM

## 2018-04-22 DIAGNOSIS — R05 Cough: Secondary | ICD-10-CM

## 2018-04-22 DIAGNOSIS — R0609 Other forms of dyspnea: Secondary | ICD-10-CM | POA: Diagnosis present

## 2018-05-05 ENCOUNTER — Encounter (HOSPITAL_COMMUNITY): Payer: Self-pay | Admitting: Emergency Medicine

## 2018-05-05 ENCOUNTER — Emergency Department (HOSPITAL_COMMUNITY)
Admission: EM | Admit: 2018-05-05 | Discharge: 2018-05-05 | Disposition: A | Discharge status: Home / Self Care | Payer: Medicaid Other | Attending: Emergency Medicine | Admitting: Emergency Medicine

## 2018-05-05 ENCOUNTER — Other Ambulatory Visit: Payer: Self-pay

## 2018-05-05 DIAGNOSIS — I10 Essential (primary) hypertension: Secondary | ICD-10-CM | POA: Diagnosis not present

## 2018-05-05 DIAGNOSIS — R42 Dizziness and giddiness: Secondary | ICD-10-CM | POA: Insufficient documentation

## 2018-05-05 DIAGNOSIS — Z79899 Other long term (current) drug therapy: Secondary | ICD-10-CM | POA: Insufficient documentation

## 2018-05-05 DIAGNOSIS — F17210 Nicotine dependence, cigarettes, uncomplicated: Secondary | ICD-10-CM | POA: Insufficient documentation

## 2018-05-05 LAB — CBC WITH DIFFERENTIAL/PLATELET
Abs Immature Granulocytes: 0.04 10*3/uL (ref 0.00–0.07)
BASOS ABS: 0.1 10*3/uL (ref 0.0–0.1)
Basophils Relative: 1 %
EOS ABS: 0 10*3/uL (ref 0.0–0.5)
EOS PCT: 0 %
HEMATOCRIT: 47.6 % — AB (ref 36.0–46.0)
HEMOGLOBIN: 15.1 g/dL — AB (ref 12.0–15.0)
Immature Granulocytes: 0 %
LYMPHS ABS: 3.1 10*3/uL (ref 0.7–4.0)
LYMPHS PCT: 26 %
MCH: 28.6 pg (ref 26.0–34.0)
MCHC: 31.7 g/dL (ref 30.0–36.0)
MCV: 90.2 fL (ref 80.0–100.0)
MONO ABS: 0.7 10*3/uL (ref 0.1–1.0)
MONOS PCT: 6 %
Neutro Abs: 7.8 10*3/uL — ABNORMAL HIGH (ref 1.7–7.7)
Neutrophils Relative %: 67 %
Platelets: 237 10*3/uL (ref 150–400)
RBC: 5.28 MIL/uL — ABNORMAL HIGH (ref 3.87–5.11)
RDW: 13.8 % (ref 11.5–15.5)
WBC: 11.7 10*3/uL — ABNORMAL HIGH (ref 4.0–10.5)
nRBC: 0 % (ref 0.0–0.2)

## 2018-05-05 LAB — BASIC METABOLIC PANEL
Anion gap: 7 (ref 5–15)
BUN: 13 mg/dL (ref 8–23)
CALCIUM: 9.4 mg/dL (ref 8.9–10.3)
CO2: 23 mmol/L (ref 22–32)
CREATININE: 0.92 mg/dL (ref 0.44–1.00)
Chloride: 108 mmol/L (ref 98–111)
GFR calc Af Amer: >60 mL/min (ref >60)
GLUCOSE: 112 mg/dL — AB (ref 70–99)
Potassium: 4.2 mmol/L (ref 3.5–5.1)
Sodium: 138 mmol/L (ref 135–145)

## 2018-05-05 LAB — TROPONIN I: Troponin I: <0.03 ng/mL (ref <0.03)

## 2018-05-05 MED ORDER — MECLIZINE HCL 12.5 MG PO TABS: 12.5000 mg | ORAL_TABLET | Freq: Three times a day (TID) | ORAL | 0 refills | Status: DC | PRN

## 2018-05-05 MED ORDER — SODIUM CHLORIDE 0.9 % IV BOLUS
1000.0000 mL | Freq: Once | INTRAVENOUS | Status: AC
  Administered 2018-05-05: 1000 mL via INTRAVENOUS

## 2018-05-05 MED ORDER — MECLIZINE HCL 12.5 MG PO TABS
25.0000 mg | ORAL_TABLET | Freq: Once | ORAL | Status: AC
  Administered 2018-05-05: 25 mg via ORAL
  Filled 2018-05-05: qty 2

## 2018-05-05 NOTE — ED Provider Notes (Signed)
[facility] EMERGENCY DEPARTMENT Provider Note   CSN: [id number] Arrival date & time: 05/05/18  1626     History   Chief Complaint Chief Complaint  Patient presents with  . Dizziness    HPI Alison Hansen is a 63 y.o. female.  She said she woke up today feeling dizzy which she describes somewhere between lightheadedness like she might fall over and also unbalanced.  She said this is the third or fourth episode of this that she has had in the last was a few months ago.  She has been treated with meclizine before.  She denies any headache no blurry vision or double vision no numbness or weakness.  She walked into the department without any difficulty.  She is a little nauseous but no vomiting no chest pain or shortness of breath.  The history is provided by the patient.  Dizziness  Quality:  Lightheadedness and imbalance Severity:  Moderate Onset quality:  Unable to specify Timing:  Intermittent Progression:  Improving Chronicity:  Recurrent Context: bending over and physical activity   Relieved by:  Lying down Worsened by:  Movement Associated symptoms: nausea   Associated symptoms: no chest pain, no headaches, no shortness of breath, no syncope, no tinnitus, no vision changes, no vomiting and no weakness   Risk factors: hx of vertigo     Past Medical History:  Diagnosis Date  . Glaucoma   . Hypertension     Patient Active Problem List   Diagnosis Date Noted  . 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 DERMATITIS 07/31/2009  . ACUTE SINUSITIS, UNSPECIFIED 06/07/2009  . HEADACHE 06/07/2009  . CHEST PAIN UNSPECIFIED 06/07/2009  . CELLULITIS 02/05/2009  . Vaginitis and vulvovaginitis, unspecified 01/26/2009  . DERMATOMYCOSIS 12/02/2008  . HYPERLIPIDEMIA 12/02/2008  . UNSPECIFIED HEMORRHOIDS WITH OTHER COMPLICATION 05/39/7673  . RECTAL POLYPS 12/02/2008  . OBESITY 10/15/2007  . NICOTINE ADDICTION 10/15/2007  . DEPRESSION 10/15/2007  . INFLAMMATORY DISEASE OF BREAST 10/15/2007  . LEG  PAIN, BILATERAL 10/15/2007    Past Surgical History:  Procedure Laterality Date  . EYE SURGERY    . eye sx    . TUBAL LIGATION    . TUBAL LIGATION       OB History    Gravida  4   Para  4   Term  4   Preterm      AB      Living        SAB      TAB      Ectopic      Multiple      Live Births               Home Medications    Prior to Admission medications   Medication Sig Start Date End Date Taking? Authorizing Provider  acyclovir (ZOVIRAX) 800 MG tablet Take 1 tablet (800 mg total) by mouth 5 (five) times daily. For 7 days Patient not taking: Reported on 08/12/2017 12/22/14   Triplett, Tammy, PA-C  cephALEXin (KEFLEX) 500 MG capsule Take 1 capsule (500 mg total) by mouth 4 (four) times daily. Patient not taking: Reported on 08/12/2017 07/14/15   Ward, Delice Bison, DO  cetirizine-pseudoephedrine (ZYRTEC-D) 5-120 MG tablet Take 1 tablet by mouth 2 (two) times daily. Patient not taking: Reported on 08/12/2017 01/05/17   Evalee Jefferson, PA-C  dexamethasone (DECADRON) 4 MG tablet Take 1 tablet (4 mg total) by mouth 2 (two) times daily with a meal. 08/12/17   Lily Kocher, PA-C  doxycycline (VIBRAMYCIN)  100 MG capsule Take 1 capsule (100 mg total) by mouth 2 (two) times daily. 08/06/17   Caccavale, Sophia, PA-C  ibuprofen (ADVIL,MOTRIN) 800 MG tablet Take 1 tablet (800 mg total) by mouth every 8 (eight) hours as needed for mild pain. Patient not taking: Reported on 08/12/2017 07/14/15   Ward, Delice Bison, DO  oxyCODONE-acetaminophen (PERCOCET/ROXICET) 5-325 MG tablet Take 1-2 tablets by mouth every 6 (six) hours as needed. Patient not taking: Reported on 08/12/2017 07/14/15   Ward, Delice Bison, DO  predniSONE (DELTASONE) 10 MG tablet Take 6 tablets day one, 5 tablets day two, 4 tablets day three, 3 tablets day four, 2 tablets day five, then 1 tablet day six Patient not taking: Reported on 08/12/2017 01/05/17   Evalee Jefferson, PA-C    Family History Family History  Problem Relation Age  of Onset  . Diabetes Mother   . Cancer Sister   . Cancer Other     Social History Social History   Tobacco Use  . Smoking status: Current Every Day Smoker    Packs/day: 1.00    Types: Cigarettes  . Smokeless tobacco: Never Used  Substance Use Topics  . Alcohol use: Yes    Comment: 40 oz daily  . Drug use: No     Allergies   Doxycycline   Review of Systems Review of Systems  Constitutional: Negative for fever.  HENT: Negative for sore throat and tinnitus.   Eyes: Negative for visual disturbance.  Respiratory: Negative for shortness of breath.   Cardiovascular: Negative for chest pain and syncope.  Gastrointestinal: Positive for nausea. Negative for abdominal pain and vomiting.  Genitourinary: Negative for dysuria.  Musculoskeletal: Negative for neck pain.  Skin: Negative for rash.  Neurological: Positive for dizziness. Negative for weakness and headaches.     Physical Exam Updated Vital Signs BP (!) 191/93 (BP Location: Right Arm)   Pulse (!) 113   Temp 98.8 F (37.1 C) (Oral) Comment (Src): Simultaneous filing. User may not have seen previous data.  Resp 18   Ht 5\' 4"  (1.626 m)   Wt 83 kg   SpO2 94%   BMI 31.41 kg/m   Physical Exam Vitals signs and nursing note reviewed.  Constitutional:      General: She is not in acute distress.    Appearance: She is well-developed.  HENT:     Head: Normocephalic and atraumatic.     Right Ear: Tympanic membrane normal.     Left Ear: Tympanic membrane normal.     Mouth/Throat:     Mouth: Mucous membranes are moist.     Pharynx: Oropharynx is clear.  Eyes:     General:        Right eye: No discharge.        Left eye: No discharge.     Extraocular Movements: Extraocular movements intact.     Conjunctiva/sclera: Conjunctivae normal.     Pupils: Pupils are equal, round, and reactive to light.     Comments: Some lazy eye on the right.  Neck:     Musculoskeletal: Normal range of motion and neck supple. No muscular  tenderness.  Cardiovascular:     Rate and Rhythm: Normal rate and regular rhythm.     Heart sounds: No murmur.  Pulmonary:     Effort: Pulmonary effort is normal. No respiratory distress.     Breath sounds: Normal breath sounds.  Abdominal:     Palpations: Abdomen is soft.     Tenderness: There is no  abdominal tenderness.  Musculoskeletal: Normal range of motion.        General: No tenderness or signs of injury.  Skin:    General: Skin is warm and dry.     Capillary Refill: Capillary refill takes less than 2 seconds.  Neurological:     General: No focal deficit present.     Mental Status: She is alert and oriented to person, place, and time.     Cranial Nerves: No cranial nerve deficit.     Sensory: No sensory deficit.     Motor: No weakness.     Gait: Gait normal.      ED Treatments / Results  Labs (all labs ordered are listed, but only abnormal results are displayed) Labs Reviewed  BASIC METABOLIC PANEL - Abnormal; Notable for the following components:      Result Value   Glucose, Bld 112 (*)    All other components within normal limits  CBC WITH DIFFERENTIAL/PLATELET - Abnormal; Notable for the following components:   WBC 11.7 (*)    RBC 5.28 (*)    Hemoglobin 15.1 (*)    HCT 47.6 (*)    Neutro Abs 7.8 (*)    All other components within normal limits  TROPONIN I    EKG EKG Interpretation  Date/Time:  Wednesday May 05 2018 16:38:33 EST Ventricular Rate:  116 PR Interval:  126 QRS Duration: 82 QT Interval:  344 QTC Calculation: 478 R Axis:   80 Text Interpretation:  Sinus tachycardia Right atrial enlargement Borderline ECG no prior to compare with Confirmed by Aletta Edouard [phone]) on 05/05/2018 5:38:35 PM   Radiology No results found.  Procedures Procedures (including critical care time)  Medications Ordered in ED Medications  sodium chloride 0.9 % bolus 1,000 mL (0 mLs Intravenous Stopped 05/05/18 1950)  meclizine (ANTIVERT) tablet 25 mg (25 mg  Oral Given 05/05/18 1841)     Initial Impression / Assessment and Plan / ED Course  I have reviewed the triage vital signs and the nursing notes.  Pertinent labs & imaging results that were available during my care of the patient were reviewed by me and considered in my medical decision making (see chart for details).  Clinical Course as of May 06 917  Wed May 05, 2018  1956 Evaluated patient.  She says her head feels a little better after the meclizine and the fluids.  Will finish out the rest of the bag of fluids and get her up and walk her and see how she is feeling.   [MB]  2046 Patient states she is feeling better and is eaten here and would like to go home.  She understands to follow-up with her doctor and return if any worsening symptoms.   [MB]    Clinical Course User Index [MB] Hayden Rasmussen, MD      Final Clinical Impressions(s) / ED Diagnoses   Final diagnoses:  Dizziness    ED Discharge Orders         Ordered    meclizine (ANTIVERT) 12.5 MG tablet  3 times daily PRN     05/05/18 2047           Hayden Rasmussen, MD 05/06/18 [phone]

## 2018-05-05 NOTE — ED Notes (Signed)
Pt given meal tray.

## 2018-05-05 NOTE — ED Triage Notes (Signed)
Patient states she was drinking heavily last night.

## 2018-05-05 NOTE — ED Triage Notes (Signed)
Patient reports dizziness and chills that started 3-4 hours ago.

## 2018-05-05 NOTE — Discharge Instructions (Signed)
You were seen in the emergency department for feeling dizzy and unsteady.  Your symptoms improved with some fluids and meclizine.  We are sending you home with a prescription for the meclizine.  Please keep well-hydrated and follow-up with your doctor.  Return if any worsening symptoms.

## 2018-05-05 NOTE — ED Notes (Signed)
Okay for pt to eat per Dr Melina Copa. Pt says she feels better and denies pain.

## 2018-06-14 ENCOUNTER — Encounter (INDEPENDENT_AMBULATORY_CARE_PROVIDER_SITE_OTHER): Payer: Self-pay | Admitting: *Deleted

## 2018-08-03 ENCOUNTER — Encounter (INDEPENDENT_AMBULATORY_CARE_PROVIDER_SITE_OTHER): Payer: Self-pay | Admitting: *Deleted

## 2018-11-04 ENCOUNTER — Other Ambulatory Visit: Payer: Self-pay | Admitting: General Surgery

## 2018-11-04 DIAGNOSIS — D242 Benign neoplasm of left breast: Secondary | ICD-10-CM

## 2018-11-10 ENCOUNTER — Other Ambulatory Visit: Payer: Self-pay | Admitting: General Surgery

## 2018-11-10 DIAGNOSIS — D242 Benign neoplasm of left breast: Secondary | ICD-10-CM

## 2018-12-03 NOTE — H&P (Signed)
Jesus Genera Location: [facility] Surgery Patient #: [id number] DOB: 06/03/1955 Single / Language: Cleophus Molt / Race: Black or African American Female       History of Present Illness       This is a 63 year old woman from [place]. Referred by Dr. Jeanmarie Plant at the [facility] and [facility] health radiology for surgical management of a papilloma of the left breast. Her PCP is Dr.Barrino in [place].      She has no prior history of significant breast problems. She's had left nipple inversion for over 10 years. She thought she had some drainage of the left nipple. She gets mammograms fairly regularly. Current mammogram show a small density in the left breast, lower inner quadrant, 8 o'clock position. Image guided biopsy shows intraductal papilloma. No atypia. She's had no problems since the biopsy.      Past history is negative for breast disease. Hypertension. Tubal ligation. Glaucoma. Smokes daily Family history significant for sister with breast cancer. Survivor. No ovarian cancer. Social history reveals she lives in [place]. Single. Has 4 children. Smokes cigarettes daily. 10 alcoholic drinks a week. Lives with her daughter and grandson. Used to work in a [employer] now retired.      I  explained the risk of papilloma. 5% or slightly higher of upgrade to low-grade cancer with excision. Excision recommended. Family history also increases her lifetime risk and she understands that. She wants to go ahead and have this done. She will be scheduled for left breast lumpectomy with RS L. I discussed the indications, details, techniques, and it was risk of the surgery with her. She is aware of the risk of bleeding, infection, cosmetic deformity, insensate nipple, chronic pain, reoperation if cancer. She understands all of these issues. All questions were answered. She agrees with this plan.     Allergie Doxycycline *DERMATOLOGICALS*  Allergies Reconciled   Medication  History  Latanoprost (0.005% Solution, Ophthalmic) Active. Meclizine HCl (12.5MG  Tablet, Oral) Active. Timolol Maleate (0.5% Solution, Ophthalmic) Active. Losartan Potassium (25MG  Tablet, Oral) Active. Medications Reconciled  Weight: 187.4 lb Height: 63in Body Surface Area: 1.88 m Body Mass Index: 33.2 kg/m  Temp.: 97.46F  Pulse: 78 (Regular)  BP: 140/80(Sitting, Left Arm, Standard)       Physical Exam General Mental Status-Alert. General Appearance-Consistent with stated age. Hydration-Well hydrated. Voice-Normal.  Head and Neck Head-normocephalic, atraumatic with no lesions or palpable masses. Trachea-midline. Thyroid Gland Characteristics - normal size and consistency.  Eye Eyeball - Bilateral-Extraocular movements intact. Sclera/Conjunctiva - Bilateral-No scleral icterus.  Chest and Lung Exam Chest and lung exam reveals -quiet, even and easy respiratory effort with no use of accessory muscles and on auscultation, normal breath sounds, no adventitious sounds and normal vocal resonance. Inspection Chest Wall - Normal. Back - normal.  Breast Note: Breasts are moderately large and symmetrical. Right nipple is everted. Left nipple is inverted. I cannot demonstrate or elicit a discharge. I don't feel any mass in either breast. No axillary adenopathy on either side.   Cardiovascular Cardiovascular examination reveals -normal heart sounds, regular rate and rhythm with no murmurs and normal pedal pulses bilaterally.  Abdomen Inspection Inspection of the abdomen reveals - No Hernias. Skin - Scar - no surgical scars. Palpation/Percussion Palpation and Percussion of the abdomen reveal - Soft, Non Tender, No Rebound tenderness, No Rigidity (guarding) and No hepatosplenomegaly. Auscultation Auscultation of the abdomen reveals - Bowel sounds normal.  Neurologic Neurologic evaluation reveals -alert and oriented x 3 with no  impairment of recent or  remote memory. Mental Status-Normal.  Musculoskeletal Normal Exam - Left-Upper Extremity Strength Normal and Lower Extremity Strength Normal. Normal Exam - Right-Upper Extremity Strength Normal and Lower Extremity Strength Normal.  Lymphatic Head & Neck  General Head & Neck Lymphatics: Bilateral - Description - Normal. Axillary  General Axillary Region: Bilateral - Description - Normal. Tenderness - Non Tender. Femoral & Inguinal  Generalized Femoral & Inguinal Lymphatics: Bilateral - Description - Normal. Tenderness - Non Tender.    Assessment & Plan   PAPILLOMA OF LEFT BREAST (D24.2)   Your recent mammograms show a small lump in the left breast lower inner quadrant Biopsy shows intraductal papilloma, and no actual cancer cells were seen. Your left nipple is inverted but you states that has been that way for many years You report a left nipple discharge but I cannot reproduce that today  There is a small risk that you have cancer right now, at least 5% Most likely you do not have cancer I have advised a conservative left breast lumpectomy with radioactive seed localization You agree with this plan I have discussed the indications, techniques, and risk of the surgery with you in detail   HYPERTENSION, ESSENTIAL, BENIGN (I10)  GLAUCOMA (H40.9)  SMOKES TOBACCO DAILY (F17.200)  FAMILY HISTORY OF BREAST CANCER IN SISTER (Z80.3)    Kwinton Maahs M. Dalbert Batman, M.D., [facility] General and Minimally invasive Surgery Breast and Colorectal Surgery Office:   [phone] Pager:   [phone]

## 2018-12-03 NOTE — Progress Notes (Signed)
[place], [place] - [id number] [place] [address] Phone: [phone] Fax: [phone]      Your procedure is scheduled on August 7th.  Report to [facility] Main Entrance "A" at 5:30 A.M., and check in at the Admitting office.  Call this number if you have problems the morning of surgery:  [phone]  Call [phone] if you have any questions prior to your surgery date Monday-Friday 8am-4pm    Remember:  Do not eat after midnight the night before your surgery  You may drink clear liquids until 4:30 the morning of your surgery.   Clear liquids allowed are: Water, Non-Citrus Juices (without pulp), Carbonated Beverages, Clear Tea, Black Coffee Only, and Gatorade    Take these medicines the morning of surgery with A SIP OF WATER   Eye drops - if needed  Meclizine (Antivert) - if needed  7 days prior to surgery STOP taking any Aspirin (unless otherwise instructed by your surgeon), Aleve, Naproxen, Ibuprofen, Motrin, Advil, Goody's, BC's, all herbal medications, fish oil, and all vitamins.    The Morning of Surgery  Do not wear jewelry, make-up or nail polish.  Do not wear lotions, powders, or perfumes, or deodorant  Do not shave 48 hours prior to surgery.    Do not bring valuables to the hospital.  [facility] is not responsible for any belongings or valuables.  If you are a smoker, DO NOT Smoke 24 hours prior to surgery IF you wear a CPAP at night please bring your mask, tubing, and machine the morning of surgery   Remember that you must have someone to transport you home after your surgery, and remain with you for 24 hours if you are discharged the same day.   Contacts, glasses, hearing aids, dentures or bridgework may not be worn into surgery.    Leave your suitcase in the car.  After surgery it may be brought to your room.  For patients admitted to the hospital, discharge time will be determined by your treatment  team.  Patients discharged the day of surgery will not be allowed to drive home.    Special instructions:   [place]- Preparing For Surgery  Before surgery, you can play an important role. Because skin is not sterile, your skin needs to be as free of germs as possible. You can reduce the number of germs on your skin by washing with CHG (chlorahexidine gluconate) Soap before surgery.  CHG is an antiseptic cleaner which kills germs and bonds with the skin to continue killing germs even after washing.    Oral Hygiene is also important to reduce your risk of infection.  Remember - BRUSH YOUR TEETH THE MORNING OF SURGERY WITH YOUR REGULAR TOOTHPASTE  Please do not use if you have an allergy to CHG or antibacterial soaps. If your skin becomes reddened/irritated stop using the CHG.  Do not shave (including legs and underarms) for at least 48 hours prior to first CHG shower. It is OK to shave your face.  Please follow these instructions carefully.   1. Shower the NIGHT BEFORE SURGERY and the MORNING OF SURGERY with CHG Soap.   2. If you chose to wash your hair, wash your hair first as usual with your normal shampoo.  3. After you shampoo, rinse your hair and body thoroughly to remove the shampoo.  4. Use CHG as you would any other liquid soap. You can apply CHG directly to the  skin and wash gently with a scrungie or a clean washcloth.   5. Apply the CHG Soap to your body ONLY FROM THE NECK DOWN.  Do not use on open wounds or open sores. Avoid contact with your eyes, ears, mouth and genitals (private parts). Wash Face and genitals (private parts)  with your normal soap.   6. Wash thoroughly, paying special attention to the area where your surgery will be performed.  7. Thoroughly rinse your body with warm water from the neck down.  8. DO NOT shower/wash with your normal soap after using and rinsing off the CHG Soap.  9. Pat yourself dry with a CLEAN TOWEL.  10. Wear CLEAN PAJAMAS to bed  the night before surgery, wear comfortable clothes the morning of surgery  11. Place CLEAN SHEETS on your bed the night of your first shower and DO NOT SLEEP WITH PETS.    Day of Surgery:  Do not apply any deodorants/lotions. Please shower the morning of surgery with the CHG soap  Please wear clean clothes to the hospital/surgery center.   Remember to brush your teeth WITH YOUR REGULAR TOOTHPASTE.   Please read over the following fact sheets that you were given.

## 2018-12-06 ENCOUNTER — Encounter (HOSPITAL_COMMUNITY): Payer: Self-pay

## 2018-12-06 ENCOUNTER — Other Ambulatory Visit: Payer: Self-pay

## 2018-12-06 ENCOUNTER — Encounter (HOSPITAL_COMMUNITY)
Admission: RE | Admit: 2018-12-06 | Discharge: 2018-12-06 | Disposition: A | Discharge status: Home / Self Care | Payer: Medicaid Other | Source: Ambulatory Visit | Attending: General Surgery | Admitting: General Surgery

## 2018-12-06 DIAGNOSIS — Z01812 Encounter for preprocedural laboratory examination: Secondary | ICD-10-CM | POA: Insufficient documentation

## 2018-12-06 HISTORY — DX: Gastro-esophageal reflux disease without esophagitis: K21.9

## 2018-12-06 HISTORY — DX: Depression, unspecified: F32.A

## 2018-12-06 HISTORY — DX: Anxiety disorder, unspecified: F41.9

## 2018-12-06 LAB — CBC
HCT: 44.5 % (ref 36.0–46.0)
Hemoglobin: 14 g/dL (ref 12.0–15.0)
MCH: 28.5 pg (ref 26.0–34.0)
MCHC: 31.5 g/dL (ref 30.0–36.0)
MCV: 90.4 fL (ref 80.0–100.0)
Platelets: 297 10*3/uL (ref 150–400)
RBC: 4.92 MIL/uL (ref 3.87–5.11)
RDW: 13.7 % (ref 11.5–15.5)
WBC: 9 10*3/uL (ref 4.0–10.5)
nRBC: 0 % (ref 0.0–0.2)

## 2018-12-06 LAB — BASIC METABOLIC PANEL
Anion gap: 10 (ref 5–15)
BUN: 10 mg/dL (ref 8–23)
CO2: 20 mmol/L — ABNORMAL LOW (ref 22–32)
Calcium: 9.3 mg/dL (ref 8.9–10.3)
Chloride: 109 mmol/L (ref 98–111)
Creatinine, Ser: 0.93 mg/dL (ref 0.44–1.00)
GFR calc Af Amer: >60 mL/min (ref >60)
GFR calc non Af Amer: >60 mL/min (ref >60)
Glucose, Bld: 101 mg/dL — ABNORMAL HIGH (ref 70–99)
Potassium: 3.9 mmol/L (ref 3.5–5.1)
Sodium: 139 mmol/L (ref 135–145)

## 2018-12-06 NOTE — Progress Notes (Signed)
PCP - Chapman Fitch Cardiologist - denies  Chest x-ray - 04-22-18 EKG - 05-06-18  Anesthesia review: n/a  Patient denies shortness of breath, fever, cough and chest pain at PAT appointment   Patient verbalized understanding of instructions that were given to them at the PAT appointment. Patient was also instructed that they will need to review over the PAT instructions again at home before surgery.

## 2018-12-07 ENCOUNTER — Other Ambulatory Visit (HOSPITAL_COMMUNITY)
Admission: RE | Admit: 2018-12-07 | Discharge: 2018-12-07 | Disposition: A | Discharge status: Home / Self Care | Payer: Medicaid Other | Source: Ambulatory Visit | Attending: General Surgery | Admitting: General Surgery

## 2018-12-07 DIAGNOSIS — Z01812 Encounter for preprocedural laboratory examination: Secondary | ICD-10-CM | POA: Diagnosis present

## 2018-12-07 DIAGNOSIS — Z20828 Contact with and (suspected) exposure to other viral communicable diseases: Secondary | ICD-10-CM | POA: Insufficient documentation

## 2018-12-07 LAB — SARS CORONAVIRUS 2 (TAT 6-24 HRS): SARS Coronavirus 2: NEGATIVE

## 2018-12-09 ENCOUNTER — Ambulatory Visit
Admission: RE | Admit: 2018-12-09 | Discharge: 2018-12-09 | Disposition: A | Discharge status: Home / Self Care | Payer: Medicaid Other | Source: Ambulatory Visit | Attending: General Surgery | Admitting: General Surgery

## 2018-12-09 ENCOUNTER — Other Ambulatory Visit: Payer: Self-pay

## 2018-12-09 ENCOUNTER — Encounter (HOSPITAL_COMMUNITY): Payer: Self-pay | Admitting: Anesthesiology

## 2018-12-09 DIAGNOSIS — D242 Benign neoplasm of left breast: Secondary | ICD-10-CM

## 2018-12-09 NOTE — Anesthesia Preprocedure Evaluation (Addendum)
Anesthesia Evaluation  Patient identified by MRN, date of birth, ID band Patient awake    Reviewed: Allergy & Precautions, NPO status , Patient's Chart, lab work & pertinent test results  Airway Mallampati: II  TM Distance: >3 FB Neck ROM: Full    Dental  (+) Edentulous Upper, Partial Lower, Poor Dentition   Pulmonary Current Smoker,    Pulmonary exam normal breath sounds clear to auscultation       Cardiovascular hypertension, Pt. on medications Normal cardiovascular exam Rhythm:Regular Rate:Normal     Neuro/Psych  Headaches, PSYCHIATRIC DISORDERS Anxiety Depression Glaucoma     GI/Hepatic Neg liver ROS, GERD  Medicated and Controlled,  Endo/Other  Left breast papilloma Obesity   Renal/GU negative Renal ROS  negative genitourinary   Musculoskeletal negative musculoskeletal ROS (+)   Abdominal (+) + obese,   Peds  Hematology negative hematology ROS (+)   Anesthesia Other Findings   Reproductive/Obstetrics HSV                          Anesthesia Physical Anesthesia Plan  ASA: II  Anesthesia Plan: General   Post-op Pain Management:    Induction: Intravenous  PONV Risk Score and Plan: 3 and Midazolam, Dexamethasone, Ondansetron and Treatment may vary due to age or medical condition  Airway Management Planned: LMA  Additional Equipment:   Intra-op Plan:   Post-operative Plan: Extubation in OR  Informed Consent: I have reviewed the patients History and Physical, chart, labs and discussed the procedure including the risks, benefits and alternatives for the proposed anesthesia with the patient or authorized representative who has indicated his/her understanding and acceptance.     Dental advisory given  Plan Discussed with: CRNA and Surgeon  Anesthesia Plan Comments:        Anesthesia Quick Evaluation

## 2018-12-10 ENCOUNTER — Encounter (HOSPITAL_COMMUNITY): Payer: Self-pay

## 2018-12-10 ENCOUNTER — Ambulatory Visit (HOSPITAL_COMMUNITY): Payer: Medicaid Other | Admitting: Anesthesiology

## 2018-12-10 ENCOUNTER — Ambulatory Visit (HOSPITAL_COMMUNITY)
Admission: RE | Admit: 2018-12-10 | Discharge: 2018-12-10 | Disposition: A | Discharge status: Home / Self Care | Payer: Medicaid Other | Attending: General Surgery | Admitting: General Surgery

## 2018-12-10 ENCOUNTER — Encounter (HOSPITAL_COMMUNITY)
Admission: RE | Disposition: A | Discharge status: Home / Self Care | Payer: Self-pay | Source: Home / Self Care | Attending: General Surgery

## 2018-12-10 ENCOUNTER — Ambulatory Visit
Admission: RE | Admit: 2018-12-10 | Discharge: 2018-12-10 | Disposition: A | Discharge status: Home / Self Care | Payer: Medicaid Other | Source: Ambulatory Visit | Attending: General Surgery | Admitting: General Surgery

## 2018-12-10 DIAGNOSIS — I10 Essential (primary) hypertension: Secondary | ICD-10-CM | POA: Insufficient documentation

## 2018-12-10 DIAGNOSIS — Z79899 Other long term (current) drug therapy: Secondary | ICD-10-CM | POA: Insufficient documentation

## 2018-12-10 DIAGNOSIS — Z803 Family history of malignant neoplasm of breast: Secondary | ICD-10-CM | POA: Diagnosis not present

## 2018-12-10 DIAGNOSIS — K219 Gastro-esophageal reflux disease without esophagitis: Secondary | ICD-10-CM | POA: Diagnosis not present

## 2018-12-10 DIAGNOSIS — H409 Unspecified glaucoma: Secondary | ICD-10-CM | POA: Diagnosis not present

## 2018-12-10 DIAGNOSIS — D242 Benign neoplasm of left breast: Secondary | ICD-10-CM

## 2018-12-10 DIAGNOSIS — Z6833 Body mass index (BMI) 33.0-33.9, adult: Secondary | ICD-10-CM | POA: Insufficient documentation

## 2018-12-10 DIAGNOSIS — E669 Obesity, unspecified: Secondary | ICD-10-CM | POA: Insufficient documentation

## 2018-12-10 DIAGNOSIS — F17210 Nicotine dependence, cigarettes, uncomplicated: Secondary | ICD-10-CM | POA: Insufficient documentation

## 2018-12-10 HISTORY — DX: Benign neoplasm of left breast: D24.2

## 2018-12-10 HISTORY — PX: BREAST LUMPECTOMY WITH RADIOACTIVE SEED LOCALIZATION: SHX6424

## 2018-12-10 SURGERY — BREAST LUMPECTOMY WITH RADIOACTIVE SEED LOCALIZATION
Anesthesia: General | Site: Breast | Laterality: Left

## 2018-12-10 MED ORDER — LACTATED RINGERS IV SOLN
INTRAVENOUS | Status: DC | PRN
  Administered 2018-12-10: 07:00:00 via INTRAVENOUS

## 2018-12-10 MED ORDER — LABETALOL HCL 5 MG/ML IV SOLN
INTRAVENOUS | Status: AC
  Filled 2018-12-10: qty 4

## 2018-12-10 MED ORDER — CEFAZOLIN SODIUM-DEXTROSE 2-3 GM-%(50ML) IV SOLR
INTRAVENOUS | Status: DC | PRN
  Administered 2018-12-10: 2 g via INTRAVENOUS

## 2018-12-10 MED ORDER — MIDAZOLAM HCL 2 MG/2ML IJ SOLN
INTRAMUSCULAR | Status: AC
  Filled 2018-12-10: qty 2

## 2018-12-10 MED ORDER — OXYCODONE HCL 5 MG PO TABS: 5.0000 mg | ORAL_TABLET | Freq: Once | ORAL | Status: DC | PRN

## 2018-12-10 MED ORDER — HYDROCODONE-ACETAMINOPHEN 5-325 MG PO TABS: 1.0000 | ORAL_TABLET | Freq: Four times a day (QID) | ORAL | 0 refills | Status: DC | PRN

## 2018-12-10 MED ORDER — LABETALOL HCL 5 MG/ML IV SOLN
INTRAVENOUS | Status: DC | PRN
  Administered 2018-12-10 (×2): 10 mg via INTRAVENOUS

## 2018-12-10 MED ORDER — BUPIVACAINE-EPINEPHRINE 0.25% -1:200000 IJ SOLN
INTRAMUSCULAR | Status: DC | PRN
  Administered 2018-12-10: 10 mL

## 2018-12-10 MED ORDER — PROPOFOL 10 MG/ML IV BOLUS
INTRAVENOUS | Status: AC
  Filled 2018-12-10: qty 20

## 2018-12-10 MED ORDER — SODIUM CHLORIDE 0.9 % IV SOLN: 250.0000 mL | INTRAVENOUS | Status: DC | PRN

## 2018-12-10 MED ORDER — ACETAMINOPHEN 325 MG PO TABS: 650.0000 mg | ORAL_TABLET | ORAL | Status: DC | PRN

## 2018-12-10 MED ORDER — ACETAMINOPHEN 650 MG RE SUPP: 650.0000 mg | RECTAL | Status: DC | PRN

## 2018-12-10 MED ORDER — OXYCODONE HCL 5 MG/5ML PO SOLN: 5.0000 mg | Freq: Once | ORAL | Status: DC | PRN

## 2018-12-10 MED ORDER — FENTANYL CITRATE (PF) 100 MCG/2ML IJ SOLN
INTRAMUSCULAR | Status: DC | PRN
  Administered 2018-12-10 (×2): 25 ug via INTRAVENOUS

## 2018-12-10 MED ORDER — LACTATED RINGERS IV SOLN: INTRAVENOUS | Status: DC

## 2018-12-10 MED ORDER — SODIUM CHLORIDE 0.9% FLUSH: 3.0000 mL | INTRAVENOUS | Status: DC | PRN

## 2018-12-10 MED ORDER — FENTANYL CITRATE (PF) 100 MCG/2ML IJ SOLN: 25.0000 ug | INTRAMUSCULAR | Status: DC | PRN

## 2018-12-10 MED ORDER — PROPOFOL 10 MG/ML IV BOLUS
INTRAVENOUS | Status: DC | PRN
  Administered 2018-12-10: 160 mg via INTRAVENOUS

## 2018-12-10 MED ORDER — FENTANYL CITRATE (PF) 250 MCG/5ML IJ SOLN
INTRAMUSCULAR | Status: AC
  Filled 2018-12-10: qty 5

## 2018-12-10 MED ORDER — OXYCODONE HCL 5 MG PO TABS: 5.0000 mg | ORAL_TABLET | ORAL | Status: DC | PRN

## 2018-12-10 MED ORDER — ONDANSETRON HCL 4 MG/2ML IJ SOLN: 4.0000 mg | Freq: Once | INTRAMUSCULAR | Status: DC | PRN

## 2018-12-10 MED ORDER — CEFAZOLIN SODIUM-DEXTROSE 2-4 GM/100ML-% IV SOLN
INTRAVENOUS | Status: AC
  Filled 2018-12-10: qty 100

## 2018-12-10 MED ORDER — PHENYLEPHRINE 40 MCG/ML (10ML) SYRINGE FOR IV PUSH (FOR BLOOD PRESSURE SUPPORT)
PREFILLED_SYRINGE | INTRAVENOUS | Status: AC
  Filled 2018-12-10: qty 10

## 2018-12-10 MED ORDER — LIDOCAINE 2% (20 MG/ML) 5 ML SYRINGE
INTRAMUSCULAR | Status: DC | PRN
  Administered 2018-12-10: 100 mg via INTRAVENOUS

## 2018-12-10 MED ORDER — CELECOXIB 200 MG PO CAPS
ORAL_CAPSULE | ORAL | Status: AC
  Administered 2018-12-10: 200 mg
  Filled 2018-12-10: qty 1

## 2018-12-10 MED ORDER — EPHEDRINE SULFATE-NACL 50-0.9 MG/10ML-% IV SOSY
PREFILLED_SYRINGE | INTRAVENOUS | Status: DC | PRN
  Administered 2018-12-10: 10 mg via INTRAVENOUS
  Administered 2018-12-10 (×2): 5 mg via INTRAVENOUS

## 2018-12-10 MED ORDER — MEPERIDINE HCL 25 MG/ML IJ SOLN: 6.2500 mg | INTRAMUSCULAR | Status: DC | PRN

## 2018-12-10 MED ORDER — GABAPENTIN 300 MG PO CAPS
ORAL_CAPSULE | ORAL | Status: AC
  Administered 2018-12-10: 300 mg
  Filled 2018-12-10: qty 1

## 2018-12-10 MED ORDER — LIDOCAINE 2% (20 MG/ML) 5 ML SYRINGE
INTRAMUSCULAR | Status: AC
  Filled 2018-12-10: qty 5

## 2018-12-10 MED ORDER — SODIUM CHLORIDE 0.9% FLUSH: 3.0000 mL | Freq: Two times a day (BID) | INTRAVENOUS | Status: DC

## 2018-12-10 MED ORDER — MIDAZOLAM HCL 5 MG/5ML IJ SOLN
INTRAMUSCULAR | Status: DC | PRN
  Administered 2018-12-10: 2 mg via INTRAVENOUS

## 2018-12-10 MED ORDER — PHENYLEPHRINE 40 MCG/ML (10ML) SYRINGE FOR IV PUSH (FOR BLOOD PRESSURE SUPPORT)
PREFILLED_SYRINGE | INTRAVENOUS | Status: DC | PRN
  Administered 2018-12-10 (×3): 80 ug via INTRAVENOUS
  Administered 2018-12-10: 120 ug via INTRAVENOUS

## 2018-12-10 MED ORDER — ACETAMINOPHEN 500 MG PO TABS
ORAL_TABLET | ORAL | Status: AC
  Administered 2018-12-10: 1000 mg
  Filled 2018-12-10: qty 2

## 2018-12-10 MED ORDER — DEXAMETHASONE SODIUM PHOSPHATE 10 MG/ML IJ SOLN
INTRAMUSCULAR | Status: AC
  Filled 2018-12-10: qty 1

## 2018-12-10 MED ORDER — ONDANSETRON HCL 4 MG/2ML IJ SOLN
INTRAMUSCULAR | Status: AC
  Filled 2018-12-10: qty 2

## 2018-12-10 MED ORDER — 0.9 % SODIUM CHLORIDE (POUR BTL) OPTIME
TOPICAL | Status: DC | PRN
  Administered 2018-12-10: 1000 mL

## 2018-12-10 MED ORDER — ONDANSETRON HCL 4 MG/2ML IJ SOLN
INTRAMUSCULAR | Status: DC | PRN
  Administered 2018-12-10: 4 mg via INTRAVENOUS

## 2018-12-10 MED ORDER — BUPIVACAINE-EPINEPHRINE (PF) 0.25% -1:200000 IJ SOLN
INTRAMUSCULAR | Status: AC
  Filled 2018-12-10: qty 30

## 2018-12-10 MED ORDER — DEXAMETHASONE SODIUM PHOSPHATE 10 MG/ML IJ SOLN
INTRAMUSCULAR | Status: DC | PRN
  Administered 2018-12-10: 8 mg via INTRAVENOUS

## 2018-12-10 SURGICAL SUPPLY — 41 items
APPLIER CLIP 9.375 MED OPEN (MISCELLANEOUS) ×3
BINDER BREAST LRG (GAUZE/BANDAGES/DRESSINGS) IMPLANT
BINDER BREAST XLRG (GAUZE/BANDAGES/DRESSINGS) ×2 IMPLANT
CANISTER SUCT 3000ML PPV (MISCELLANEOUS) ×3 IMPLANT
CHLORAPREP W/TINT 26 (MISCELLANEOUS) ×3 IMPLANT
CLIP APPLIE 9.375 MED OPEN (MISCELLANEOUS) ×1 IMPLANT
COVER PROBE W GEL 5X96 (DRAPES) ×3 IMPLANT
COVER SURGICAL LIGHT HANDLE (MISCELLANEOUS) ×3 IMPLANT
COVER WAND RF STERILE (DRAPES) ×3 IMPLANT
DERMABOND ADVANCED (GAUZE/BANDAGES/DRESSINGS) ×2
DERMABOND ADVANCED .7 DNX12 (GAUZE/BANDAGES/DRESSINGS) ×1 IMPLANT
DEVICE DUBIN SPECIMEN MAMMOGRA (MISCELLANEOUS) ×3 IMPLANT
DRAPE CHEST BREAST 15X10 FENES (DRAPES) ×3 IMPLANT
DRSG PAD ABDOMINAL 8X10 ST (GAUZE/BANDAGES/DRESSINGS) ×3 IMPLANT
ELECT CAUTERY BLADE 6.4 (BLADE) ×3 IMPLANT
ELECT REM PT RETURN 9FT ADLT (ELECTROSURGICAL) ×3
ELECTRODE REM PT RTRN 9FT ADLT (ELECTROSURGICAL) ×1 IMPLANT
GAUZE SPONGE 4X4 12PLY STRL (GAUZE/BANDAGES/DRESSINGS) ×2 IMPLANT
GAUZE SPONGE 4X4 12PLY STRL LF (GAUZE/BANDAGES/DRESSINGS) ×3 IMPLANT
GLOVE EUDERMIC 7 POWDERFREE (GLOVE) ×6 IMPLANT
GLOVE SURG ORTHO 7.0 STRL STRW (GLOVE) ×2 IMPLANT
GOWN STRL REUS W/ TWL LRG LVL3 (GOWN DISPOSABLE) ×1 IMPLANT
GOWN STRL REUS W/ TWL XL LVL3 (GOWN DISPOSABLE) ×1 IMPLANT
GOWN STRL REUS W/TWL LRG LVL3 (GOWN DISPOSABLE) ×6
GOWN STRL REUS W/TWL XL LVL3 (GOWN DISPOSABLE) ×2
ILLUMINATOR WAVEGUIDE N/F (MISCELLANEOUS) IMPLANT
KIT BASIN OR (CUSTOM PROCEDURE TRAY) ×3 IMPLANT
KIT MARKER MARGIN INK (KITS) ×3 IMPLANT
LIGHT WAVEGUIDE WIDE FLAT (MISCELLANEOUS) IMPLANT
NDL HYPO 25GX1X1/2 BEV (NEEDLE) ×1 IMPLANT
NEEDLE HYPO 25GX1X1/2 BEV (NEEDLE) ×3 IMPLANT
NS IRRIG 1000ML POUR BTL (IV SOLUTION) ×3 IMPLANT
PACK GENERAL/GYN (CUSTOM PROCEDURE TRAY) ×3 IMPLANT
PAD ABD 8X10 STRL (GAUZE/BANDAGES/DRESSINGS) ×2 IMPLANT
SPONGE LAP 4X18 RFD (DISPOSABLE) ×3 IMPLANT
SUT MNCRL AB 4-0 PS2 18 (SUTURE) ×3 IMPLANT
SUT SILK 2 0 SH (SUTURE) ×3 IMPLANT
SUT VIC AB 3-0 SH 18 (SUTURE) ×3 IMPLANT
SYR CONTROL 10ML LL (SYRINGE) ×3 IMPLANT
TOWEL GREEN STERILE (TOWEL DISPOSABLE) ×3 IMPLANT
TOWEL GREEN STERILE FF (TOWEL DISPOSABLE) ×3 IMPLANT

## 2018-12-10 NOTE — Anesthesia Procedure Notes (Signed)
Procedure Name: LMA Insertion °Performed by: Keyshawn Hellwig H, CRNA °Pre-anesthesia Checklist: Patient identified, Emergency Drugs available, Suction available and Patient being monitored °Patient Re-evaluated:Patient Re-evaluated prior to induction °Oxygen Delivery Method: Circle System Utilized °Preoxygenation: Pre-oxygenation with 100% oxygen °Induction Type: IV induction °Ventilation: Mask ventilation without difficulty °LMA: LMA inserted °LMA Size: 4.0 °Number of attempts: 1 °Airway Equipment and Method: Bite block °Placement Confirmation: positive ETCO2 °Tube secured with: Tape °Dental Injury: Teeth and Oropharynx as per pre-operative assessment  ° ° ° ° ° ° °

## 2018-12-10 NOTE — Anesthesia Postprocedure Evaluation (Signed)
Anesthesia Post Note  Patient: Alison Hansen  Procedure(s) Performed: LEFT BREAST LUMPECTOMY WITH RADIOACTIVE SEED LOCALIZATION (Left Breast)     Patient location during evaluation: PACU Anesthesia Type: General Level of consciousness: awake and alert and oriented Pain management: pain level controlled Vital Signs Assessment: post-procedure vital signs reviewed and stable Respiratory status: spontaneous breathing, nonlabored ventilation and respiratory function stable Cardiovascular status: blood pressure returned to baseline and stable Postop Assessment: no apparent nausea or vomiting Anesthetic complications: no    Last Vitals:  Vitals:   12/10/18 0837 12/10/18 0850  BP: (!) 142/96 (!) 153/92  Pulse: 80 82  Resp: 18 18  Temp:  36.6 C  SpO2: 99% 99%    Last Pain:  Vitals:   12/10/18 0850  TempSrc:   PainSc: 0-No pain                 Briyan Kleven A.

## 2018-12-10 NOTE — Interval H&P Note (Signed)
History and Physical Interval Note:  12/10/2018 6:12 AM  Alison Hansen  has presented today for surgery, with the diagnosis of LEFT BREAST PAPILLOMA.  The various methods of treatment have been discussed with the patient and family. After consideration of risks, benefits and other options for treatment, the patient has consented to  Procedure(s): LEFT BREAST LUMPECTOMY WITH RADIOACTIVE SEED LOCALIZATION (Left) as a surgical intervention.  The patient's history has been reviewed, patient examined, no change in status, stable for surgery.  I have reviewed the patient's chart and labs.  Questions were answered to the patient's satisfaction.     Adin Hector

## 2018-12-10 NOTE — Op Note (Signed)
Patient Name:           Alison Hansen   Date of Surgery:        12/10/2018  Pre op Diagnosis:      Papilloma left breast, lower inner quadrant, retroareolar  Post op Diagnosis:    Same  Procedure:                 Left breast lumpectomy with radioactive seed localization and margin assessment  Surgeon:                     Edsel Petrin. Dalbert Batman, M.D., FACS  Assistant:                      OR staff  Operative Indications:   This is a 63 year old woman from [place]. Referred by Dr. Jeanmarie Plant at the [facility] and [facility] health radiology for surgical management of a papilloma of the left breast. Her PCP is Dr.Barrino in [place].      She has no prior history of significant breast problems. She's had left nipple inversion for over 10 years. She thought she had some drainage of the left nipple. She gets mammograms fairly regularly. Current mammogram show a small density in the left breast, lower inner quadrant, 8 o'clock position. Image guided biopsy shows intraductal papilloma. No atypia. She's had no problems since the biopsy.      Past history is negative for breast disease. Hypertension.  Smokes daily Family history significant for sister with breast cancer. Survivor. No ovarian cancer.      I  explained the risk of papilloma. 5% or slightly higher of upgrade to low-grade cancer with excision. Excision recommended. Family history also increases her lifetime risk and she understands that. She wants to go ahead and have this done. She will be scheduled for left breast lumpectomy with RSL.Marland Kitchen She agrees with this plan.  Operative Findings:       The marker clip and radioactive seed were in the lower inner quadrant of the left breast but were relatively retroareolar.  I was able to make a circumareolar hidden scar incision.  The specimen mammogram looked very good with the marker clip and the seed in the center of the specimen.  There was no grossly palpable abnormality.  The nipple was inverted preop  and looked about the same postop.  Procedure in Detail:          Following the induction of general LMA anesthesia the patient's left breast was prepped and draped in a sterile fashion.  Surgical timeout was performed.  Intravenous antibiotics were given.  0.25% Marcaine with epinephrine was used as a local infiltration anesthetic.     The neoprobe was used to localize the radioactive signal which she was immediately posterior to the areolar margin of the left breast lower inner quadrant.  I chose to make a curvilinear incision at the areolar margin with a knife.  The lumpectomy was performed using the neoprobe and electrocautery.  The specimen was removed and marked with silk sutures and a 6 color ink kit.  The specimen mammogram looked good as described above.  Specimen was sent to the pathology lab where the seed was retrieved.  The wound was irrigated.  Hemostasis was excellent.  I placed 5 metal marker clips in the walls of the lumpectomy cavity.  The breast tissues were reapproximated in several layers with interrupted sutures of 3-0 Vicryl.  The skin was closed with a running  subcuticular 4-0 Monocryl and Dermabond.  Breast binder was placed and the patient taken to PACU in stable condition.  EBL 10 to 15 cc.  Counts correct.  Complications none.    Addendum: I logged onto the PMP aware website and reviewed her prescription medication history     Goble Fudala M. Dalbert Batman, M.D., FACS General and Minimally Invasive Surgery Breast and Colorectal Surgery  12/10/2018 8:22 AM

## 2018-12-10 NOTE — Transfer of Care (Signed)
Immediate Anesthesia Transfer of Care Note  Patient: Alison Hansen  Procedure(s) Performed: LEFT BREAST LUMPECTOMY WITH RADIOACTIVE SEED LOCALIZATION (Left Breast)  Patient Location: PACU  Anesthesia Type:General  Level of Consciousness: awake  Airway & Oxygen Therapy: Patient Spontanous Breathing  Post-op Assessment: Report given to RN and Post -op Vital signs reviewed and stable  Post vital signs: Reviewed and stable  Last Vitals:  Vitals Value Taken Time  BP 136/104 12/10/18 0826  Temp    Pulse 80 12/10/18 0827  Resp 18 12/10/18 0827  SpO2 100 % 12/10/18 0827  Vitals shown include unvalidated device data.  Last Pain:  Vitals:   12/10/18 0627  TempSrc: Oral  PainSc:          Complications: No apparent anesthesia complications

## 2018-12-10 NOTE — Discharge Instructions (Signed)
[facility] °Office Phone Number [phone] ° °BREAST BIOPSY/ PARTIAL MASTECTOMY: POST OP INSTRUCTIONS ° °Always review your discharge instruction sheet given to you by the facility where your surgery was performed. ° °IF YOU HAVE DISABILITY OR FAMILY LEAVE FORMS, YOU MUST BRING THEM TO THE OFFICE FOR PROCESSING.  DO NOT GIVE THEM TO YOUR DOCTOR. ° °1. A prescription for pain medication may be given to you upon discharge.  Take your pain medication as prescribed, if needed.  If narcotic pain medicine is not needed, then you may take acetaminophen (Tylenol) or ibuprofen (Advil) as needed. °2. Take your usually prescribed medications unless otherwise directed °3. If you need a refill on your pain medication, please contact your pharmacy.  They will contact our office to request authorization.  Prescriptions will not be filled after 5pm or on week-ends. °4. You should eat very light the first 24 hours after surgery, such as soup, crackers, pudding, etc.  Resume your normal diet the day after surgery. °5. Most patients will experience some swelling and bruising in the breast.  Ice packs and a good support bra will help.  Swelling and bruising can take several days to resolve.  °6. It is common to experience some constipation if taking pain medication after surgery.  Increasing fluid intake and taking a stool softener will usually help or prevent this problem from occurring.  A mild laxative (Milk of Magnesia or Miralax) should be taken according to package directions if there are no bowel movements after 48 hours. °7. Unless discharge instructions indicate otherwise, you may remove your bandages 24-48 hours after surgery, and you may shower at that time.  You may have steri-strips (small skin tapes) in place directly over the incision.  These strips should be left on the skin for 7-10 days.  If your surgeon used skin glue on the incision, you may shower in 24 hours.  The glue will flake off over the  next 2-3 weeks.  Any sutures or staples will be removed at the office during your follow-up visit. °8. ACTIVITIES:  You may resume regular daily activities (gradually increasing) beginning the next day.  Wearing a good support bra or sports bra minimizes pain and swelling.  You may have sexual intercourse when it is comfortable. °a. You may drive when you no longer are taking prescription pain medication, you can comfortably wear a seatbelt, and you can safely maneuver your car and apply brakes. °b. RETURN TO WORK:  ______________________________________________________________________________________ °9. You should see your doctor in the office for a follow-up appointment approximately two weeks after your surgery.  Your doctor’s nurse will typically make your follow-up appointment when she calls you with your pathology report.  Expect your pathology report 2-3 business days after your surgery.  You may call to check if you do not hear from us after three days. °10. OTHER INSTRUCTIONS: _______________________________________________________________________________________________ _____________________________________________________________________________________________________________________________________ °_____________________________________________________________________________________________________________________________________ °_____________________________________________________________________________________________________________________________________ ° °WHEN TO CALL YOUR DOCTOR: °1. Fever over 101.0 °2. Nausea and/or vomiting. °3. Extreme swelling or bruising. °4. Continued bleeding from incision. °5. Increased pain, redness, or drainage from the incision. ° °The clinic staff is available to answer your questions during regular business hours.  Please don’t hesitate to call and ask to speak to one of the nurses for clinical concerns.  If you have a medical emergency, go to the nearest  emergency room or call 911.  A surgeon from [facility] is always on call at the hospital. ° °For further questions, please visit [email or web address]  ° ° ° °  See your primary care physician within the next 7 days to check your blood pressure.            Managing Your Pain After Surgery Without Opioids    Thank you for participating in our program to help patients manage their pain after surgery without opioids. This is part of our effort to provide you with the best care possible, without exposing you or your family to the risk that opioids pose.  What pain can I expect after surgery? You can expect to have some pain after surgery. This is normal. The pain is typically worse the day after surgery, and quickly begins to get better. Many studies have found that many patients are able to manage their pain after surgery with Over-the-Counter (OTC) medications such as Tylenol and Motrin. If you have a condition that does not allow you to take Tylenol or Motrin, notify your surgical team.  How will I manage my pain? The best strategy for controlling your pain after surgery is around the clock pain control with Tylenol (acetaminophen) and Motrin (ibuprofen or Advil). Alternating these medications with each other allows you to maximize your pain control. In addition to Tylenol and Motrin, you can use heating pads or ice packs on your incisions to help reduce your pain.  How will I alternate your regular strength over-the-counter pain medication? You will take a dose of pain medication every three hours. ; Start by taking 650 mg of Tylenol (2 pills of 325 mg) ; 3 hours later take 600 mg of Motrin (3 pills of 200 mg) ; 3 hours after taking the Motrin take 650 mg of Tylenol ; 3 hours after that take 600 mg of Motrin.   - 1 -  See example - if your first dose of Tylenol is at 12:00 PM   12:00 PM Tylenol 650 mg (2 pills of 325 mg)  3:00 PM Motrin 600 mg (3 pills  of 200 mg)  6:00 PM Tylenol 650 mg (2 pills of 325 mg)  9:00 PM Motrin 600 mg (3 pills of 200 mg)  Continue alternating every 3 hours   We recommend that you follow this schedule around-the-clock for at least 3 days after surgery, or until you feel that it is no longer needed. Use the table on the last page of this handout to keep track of the medications you are taking. Important: Do not take more than 3000mg  of Tylenol or 3200mg  of Motrin in a 24-hour period. Do not take ibuprofen/Motrin if you have a history of bleeding stomach ulcers, severe kidney disease, &/or actively taking a blood thinner  What if I still have pain? If you have pain that is not controlled with the over-the-counter pain medications (Tylenol and Motrin or Advil) you might have what we call breakthrough pain. You will receive a prescription for a small amount of an opioid pain medication such as Oxycodone, Tramadol, or Tylenol with Codeine. Use these opioid pills in the first 24 hours after surgery if you have breakthrough pain. Do not take more than 1 pill every 4-6 hours.  If you still have uncontrolled pain after using all opioid pills, don't hesitate to call our staff using the number provided. We will help make sure you are managing your pain in the best way possible, and if necessary, we can provide a prescription for additional pain medication.   Day 1    Time  Name of Medication Number of pills taken  Amount of Acetaminophen  Pain Level   Comments  AM PM       AM PM       AM PM       AM PM       AM PM       AM PM       AM PM       AM PM       Total Daily amount of Acetaminophen Do not take more than  3,000 mg per day      Day 2    Time  Name of Medication Number of pills taken  Amount of Acetaminophen  Pain Level   Comments  AM PM       AM PM       AM PM       AM PM       AM PM       AM PM       AM PM       AM PM       Total Daily amount of Acetaminophen Do not take more than   3,000 mg per day      Day 3    Time  Name of Medication Number of pills taken  Amount of Acetaminophen  Pain Level   Comments  AM PM       AM PM       AM PM       AM PM          AM PM       AM PM       AM PM       AM PM       Total Daily amount of Acetaminophen Do not take more than  3,000 mg per day      Day 4    Time  Name of Medication Number of pills taken  Amount of Acetaminophen  Pain Level   Comments  AM PM       AM PM       AM PM       AM PM       AM PM       AM PM       AM PM       AM PM       Total Daily amount of Acetaminophen Do not take more than  3,000 mg per day      Day 5    Time  Name of Medication Number of pills taken  Amount of Acetaminophen  Pain Level   Comments  AM PM       AM PM       AM PM       AM PM       AM PM       AM PM       AM PM       AM PM       Total Daily amount of Acetaminophen Do not take more than  3,000 mg per day       Day 6    Time  Name of Medication Number of pills taken  Amount of Acetaminophen  Pain Level  Comments  AM PM       AM PM       AM PM       AM PM       AM PM       AM PM  AM PM       AM PM       Total Daily amount of Acetaminophen Do not take more than  3,000 mg per day      Day 7    Time  Name of Medication Number of pills taken  Amount of Acetaminophen  Pain Level   Comments  AM PM       AM PM       AM PM       AM PM       AM PM       AM PM       AM PM       AM PM       Total Daily amount of Acetaminophen Do not take more than  3,000 mg per day        For additional information about how and where to safely dispose of unused opioid medications - [employer]  Disclaimer: This document contains information and/or instructional materials adapted from [place] for the typical patient with your condition. It does not replace medical advice from your health care provider because your experience may differ from that of  the typical patient. Talk to your health care provider if you have any questions about this document, your condition or your treatment plan. Adapted from [place]

## 2018-12-11 ENCOUNTER — Encounter (HOSPITAL_COMMUNITY): Payer: Self-pay | Admitting: General Surgery

## 2018-12-14 NOTE — Progress Notes (Signed)
Inform patient of Pathology report,. Breast pathology shows a papilloma which is completely benign. She will not need any further surgery. I will discuss in detail at next OV. Let me know. Thanks,  Dalbert Batman

## 2019-02-27 NOTE — Progress Notes (Deleted)
   Subjective:    Patient ID: Alison Hansen, female    DOB: 07-21-1955, 63 y.o.   MRN: [id number]  HPI Ray Giovannoni. Poellnitz is a 63 year old female with a past medial history of anxiety, depression, hypertension, glaucoma and   Past Medical History:  Diagnosis Date  . Anxiety   . Depression   . GERD (gastroesophageal reflux disease)   . Glaucoma   . Hypertension   . Papilloma of left breast 12/10/2018       Review of Systems     Objective:   Physical Exam        Assessment & Plan:

## 2019-03-03 ENCOUNTER — Ambulatory Visit (INDEPENDENT_AMBULATORY_CARE_PROVIDER_SITE_OTHER): Payer: Medicaid Other | Admitting: Nurse Practitioner

## 2019-04-19 ENCOUNTER — Encounter (HOSPITAL_BASED_OUTPATIENT_CLINIC_OR_DEPARTMENT_OTHER): Payer: Self-pay | Admitting: Ophthalmology

## 2019-04-19 ENCOUNTER — Ambulatory Visit: Payer: Self-pay | Admitting: Ophthalmology

## 2019-04-19 ENCOUNTER — Other Ambulatory Visit (HOSPITAL_COMMUNITY): Admission: RE | Admit: 2019-04-19 | Payer: Medicaid Other | Source: Ambulatory Visit

## 2019-04-19 ENCOUNTER — Other Ambulatory Visit: Payer: Self-pay

## 2019-04-20 ENCOUNTER — Other Ambulatory Visit (HOSPITAL_COMMUNITY)
Admission: RE | Admit: 2019-04-20 | Discharge: 2019-04-20 | Disposition: A | Discharge status: Home / Self Care | Payer: Medicaid Other | Source: Ambulatory Visit | Attending: Ophthalmology | Admitting: Ophthalmology

## 2019-04-20 DIAGNOSIS — Z01812 Encounter for preprocedural laboratory examination: Secondary | ICD-10-CM | POA: Insufficient documentation

## 2019-04-20 DIAGNOSIS — Z20828 Contact with and (suspected) exposure to other viral communicable diseases: Secondary | ICD-10-CM | POA: Diagnosis not present

## 2019-04-20 LAB — SARS CORONAVIRUS 2 (TAT 6-24 HRS): SARS Coronavirus 2: NEGATIVE

## 2019-04-22 ENCOUNTER — Ambulatory Visit (HOSPITAL_BASED_OUTPATIENT_CLINIC_OR_DEPARTMENT_OTHER): Payer: Medicaid Other | Admitting: Anesthesiology

## 2019-04-22 ENCOUNTER — Encounter (HOSPITAL_BASED_OUTPATIENT_CLINIC_OR_DEPARTMENT_OTHER)
Admission: RE | Disposition: A | Discharge status: Home / Self Care | Payer: Self-pay | Source: Home / Self Care | Attending: Ophthalmology

## 2019-04-22 ENCOUNTER — Ambulatory Visit: Payer: Self-pay | Admitting: Ophthalmology

## 2019-04-22 ENCOUNTER — Other Ambulatory Visit: Payer: Self-pay

## 2019-04-22 ENCOUNTER — Ambulatory Visit (HOSPITAL_BASED_OUTPATIENT_CLINIC_OR_DEPARTMENT_OTHER)
Admission: RE | Admit: 2019-04-22 | Discharge: 2019-04-22 | Disposition: A | Discharge status: Home / Self Care | Payer: Medicaid Other | Attending: Ophthalmology | Admitting: Ophthalmology

## 2019-04-22 ENCOUNTER — Encounter (HOSPITAL_BASED_OUTPATIENT_CLINIC_OR_DEPARTMENT_OTHER): Payer: Self-pay | Admitting: Ophthalmology

## 2019-04-22 DIAGNOSIS — H409 Unspecified glaucoma: Secondary | ICD-10-CM | POA: Diagnosis not present

## 2019-04-22 DIAGNOSIS — K219 Gastro-esophageal reflux disease without esophagitis: Secondary | ICD-10-CM | POA: Insufficient documentation

## 2019-04-22 DIAGNOSIS — F17200 Nicotine dependence, unspecified, uncomplicated: Secondary | ICD-10-CM | POA: Insufficient documentation

## 2019-04-22 DIAGNOSIS — F419 Anxiety disorder, unspecified: Secondary | ICD-10-CM | POA: Insufficient documentation

## 2019-04-22 DIAGNOSIS — I10 Essential (primary) hypertension: Secondary | ICD-10-CM | POA: Diagnosis not present

## 2019-04-22 DIAGNOSIS — Z6833 Body mass index (BMI) 33.0-33.9, adult: Secondary | ICD-10-CM | POA: Diagnosis not present

## 2019-04-22 DIAGNOSIS — E669 Obesity, unspecified: Secondary | ICD-10-CM | POA: Insufficient documentation

## 2019-04-22 DIAGNOSIS — Z809 Family history of malignant neoplasm, unspecified: Secondary | ICD-10-CM | POA: Insufficient documentation

## 2019-04-22 DIAGNOSIS — R519 Headache, unspecified: Secondary | ICD-10-CM | POA: Diagnosis not present

## 2019-04-22 DIAGNOSIS — F329 Major depressive disorder, single episode, unspecified: Secondary | ICD-10-CM | POA: Insufficient documentation

## 2019-04-22 DIAGNOSIS — H5010 Unspecified exotropia: Secondary | ICD-10-CM | POA: Insufficient documentation

## 2019-04-22 DIAGNOSIS — Z833 Family history of diabetes mellitus: Secondary | ICD-10-CM | POA: Insufficient documentation

## 2019-04-22 HISTORY — PX: STRABISMUS SURGERY: SHX218

## 2019-04-22 SURGERY — STRABISMUS SURGERY, BILATERAL
Anesthesia: General | Site: Eye | Laterality: Right

## 2019-04-22 MED ORDER — ONDANSETRON HCL 4 MG/2ML IJ SOLN
INTRAMUSCULAR | Status: AC
  Filled 2019-04-22: qty 2

## 2019-04-22 MED ORDER — LIDOCAINE 2% (20 MG/ML) 5 ML SYRINGE
INTRAMUSCULAR | Status: AC
  Filled 2019-04-22: qty 5

## 2019-04-22 MED ORDER — LACTATED RINGERS IV SOLN: INTRAVENOUS | Status: DC

## 2019-04-22 MED ORDER — FENTANYL CITRATE (PF) 100 MCG/2ML IJ SOLN
INTRAMUSCULAR | Status: AC
  Filled 2019-04-22: qty 2

## 2019-04-22 MED ORDER — PROPOFOL 10 MG/ML IV BOLUS
INTRAVENOUS | Status: DC | PRN
  Administered 2019-04-22: 180 mg via INTRAVENOUS

## 2019-04-22 MED ORDER — LACTATED RINGERS IV SOLN: INTRAVENOUS | Status: DC | PRN

## 2019-04-22 MED ORDER — OXYCODONE HCL 5 MG PO TABS
ORAL_TABLET | ORAL | Status: AC
  Filled 2019-04-22: qty 1

## 2019-04-22 MED ORDER — FENTANYL CITRATE (PF) 100 MCG/2ML IJ SOLN
INTRAMUSCULAR | Status: DC | PRN
  Administered 2019-04-22: 25 ug via INTRAVENOUS
  Administered 2019-04-22: 50 ug via INTRAVENOUS
  Administered 2019-04-22: 25 ug via INTRAVENOUS
  Administered 2019-04-22: 50 ug via INTRAVENOUS
  Administered 2019-04-22 (×2): 25 ug via INTRAVENOUS

## 2019-04-22 MED ORDER — MIDAZOLAM HCL 2 MG/2ML IJ SOLN
INTRAMUSCULAR | Status: AC
  Filled 2019-04-22: qty 2

## 2019-04-22 MED ORDER — FENTANYL CITRATE (PF) 100 MCG/2ML IJ SOLN: 50.0000 ug | INTRAMUSCULAR | Status: DC | PRN

## 2019-04-22 MED ORDER — CELECOXIB 400 MG PO CAPS
400.0000 mg | ORAL_CAPSULE | Freq: Once | ORAL | Status: AC
  Administered 2019-04-22: 400 mg via ORAL

## 2019-04-22 MED ORDER — KETOROLAC TROMETHAMINE 30 MG/ML IJ SOLN
INTRAMUSCULAR | Status: AC
  Filled 2019-04-22: qty 2

## 2019-04-22 MED ORDER — KETOROLAC TROMETHAMINE 30 MG/ML IJ SOLN
INTRAMUSCULAR | Status: DC | PRN
  Administered 2019-04-22: 30 mg via INTRAVENOUS

## 2019-04-22 MED ORDER — SCOPOLAMINE 1 MG/3DAYS TD PT72
MEDICATED_PATCH | TRANSDERMAL | Status: AC
  Filled 2019-04-22: qty 1

## 2019-04-22 MED ORDER — LIDOCAINE 2% (20 MG/ML) 5 ML SYRINGE
INTRAMUSCULAR | Status: DC | PRN
  Administered 2019-04-22: 60 mg via INTRAVENOUS

## 2019-04-22 MED ORDER — MIDAZOLAM HCL 2 MG/2ML IJ SOLN: 1.0000 mg | INTRAMUSCULAR | Status: DC | PRN

## 2019-04-22 MED ORDER — DEXAMETHASONE SODIUM PHOSPHATE 4 MG/ML IJ SOLN
INTRAMUSCULAR | Status: DC | PRN
  Administered 2019-04-22: 10 mg via INTRAVENOUS

## 2019-04-22 MED ORDER — OXYCODONE HCL 5 MG PO TABS
5.0000 mg | ORAL_TABLET | Freq: Once | ORAL | Status: AC
  Administered 2019-04-22: 13:00:00 5 mg via ORAL

## 2019-04-22 MED ORDER — ACETAMINOPHEN 500 MG PO TABS
ORAL_TABLET | ORAL | Status: AC
  Filled 2019-04-22: qty 2

## 2019-04-22 MED ORDER — TOBRAMYCIN-DEXAMETHASONE 0.3-0.1 % OP OINT
TOPICAL_OINTMENT | OPHTHALMIC | Status: DC | PRN
  Administered 2019-04-22: 1 via OPHTHALMIC

## 2019-04-22 MED ORDER — MIDAZOLAM HCL 5 MG/5ML IJ SOLN
INTRAMUSCULAR | Status: DC | PRN
  Administered 2019-04-22: 2 mg via INTRAVENOUS

## 2019-04-22 MED ORDER — FENTANYL CITRATE (PF) 100 MCG/2ML IJ SOLN
25.0000 ug | INTRAMUSCULAR | Status: DC | PRN
  Administered 2019-04-22: 50 ug via INTRAVENOUS

## 2019-04-22 MED ORDER — PROMETHAZINE HCL 25 MG/ML IJ SOLN: 6.2500 mg | INTRAMUSCULAR | Status: DC | PRN

## 2019-04-22 MED ORDER — ONDANSETRON HCL 4 MG/2ML IJ SOLN
INTRAMUSCULAR | Status: DC | PRN
  Administered 2019-04-22: 4 mg via INTRAVENOUS

## 2019-04-22 MED ORDER — ACETAMINOPHEN 500 MG PO TABS
1000.0000 mg | ORAL_TABLET | Freq: Once | ORAL | Status: AC
  Administered 2019-04-22: 1000 mg via ORAL

## 2019-04-22 MED ORDER — CELECOXIB 200 MG PO CAPS
ORAL_CAPSULE | ORAL | Status: AC
  Filled 2019-04-22: qty 2

## 2019-04-22 MED ORDER — SCOPOLAMINE 1 MG/3DAYS TD PT72
1.0000 | MEDICATED_PATCH | TRANSDERMAL | Status: DC
  Administered 2019-04-22: 09:00:00 1.5 mg via TRANSDERMAL

## 2019-04-22 MED ORDER — DEXAMETHASONE SODIUM PHOSPHATE 10 MG/ML IJ SOLN
INTRAMUSCULAR | Status: AC
  Filled 2019-04-22: qty 1

## 2019-04-22 SURGICAL SUPPLY — 35 items
APPLICATOR COTTON TIP 6 STRL (MISCELLANEOUS) ×8 IMPLANT
APPLICATOR COTTON TIP 6IN STRL (MISCELLANEOUS) ×16 IMPLANT
APPLICATOR DR MATTHEWS STRL (MISCELLANEOUS) ×4 IMPLANT
BNDG EYE OVAL (GAUZE/BANDAGES/DRESSINGS) IMPLANT
CAUTERY EYE LOW TEMP 1300F FIN (OPHTHALMIC RELATED) IMPLANT
CLOSURE WOUND 1/4X4 (GAUZE/BANDAGES/DRESSINGS) ×1
COVER BACK TABLE REUSABLE LG (DRAPES) ×4 IMPLANT
COVER MAYO STAND REUSABLE (DRAPES) ×4 IMPLANT
COVER WAND RF STERILE (DRAPES) IMPLANT
DRAPE HALF SHEET 70X43 (DRAPES) IMPLANT
DRAPE SURG 17X23 STRL (DRAPES) ×8 IMPLANT
DRAPE U-SHAPE 76X120 STRL (DRAPES) ×2 IMPLANT
GLOVE BIO SURGEON STRL SZ 6.5 (GLOVE) ×6 IMPLANT
GLOVE BIO SURGEONS STRL SZ 6.5 (GLOVE) ×2
GLOVE BIOGEL M STRL SZ7.5 (GLOVE) ×4 IMPLANT
GOWN STRL REUS W/ TWL LRG LVL3 (GOWN DISPOSABLE) ×2 IMPLANT
GOWN STRL REUS W/TWL LRG LVL3 (GOWN DISPOSABLE) ×2
GOWN STRL REUS W/TWL XL LVL3 (GOWN DISPOSABLE) ×4 IMPLANT
NS IRRIG 1000ML POUR BTL (IV SOLUTION) ×4 IMPLANT
PACK BASIN DAY SURGERY FS (CUSTOM PROCEDURE TRAY) ×4 IMPLANT
SPEAR EYE SURG WECK-CEL (MISCELLANEOUS) ×12 IMPLANT
SPONGE GAUZE 2X2 8PLY STER LF (GAUZE/BANDAGES/DRESSINGS) ×1
SPONGE GAUZE 2X2 8PLY STRL LF (GAUZE/BANDAGES/DRESSINGS) ×1 IMPLANT
STRIP CLOSURE SKIN 1/4X4 (GAUZE/BANDAGES/DRESSINGS) ×1 IMPLANT
SUT 6 0 SILK T G140 8DA (SUTURE) ×2 IMPLANT
SUT MERSILENE 6-0 18IN S14 8MM (SUTURE)
SUT PLAIN 6 0 TG1408 (SUTURE) ×2 IMPLANT
SUT SILK 4 0 C 3 735G (SUTURE) IMPLANT
SUT VICRYL 6 0 S 28 (SUTURE) ×2 IMPLANT
SUT VICRYL ABS 6-0 S29 18IN (SUTURE) ×4 IMPLANT
SUTURE MERSLN 6-0 18IN S14 8MM (SUTURE) IMPLANT
SYR 10ML LL (SYRINGE) ×4 IMPLANT
SYR TB 1ML LL NO SAFETY (SYRINGE) ×4 IMPLANT
TOWEL GREEN STERILE FF (TOWEL DISPOSABLE) ×4 IMPLANT
TRAY DSU PREP LF (CUSTOM PROCEDURE TRAY) ×4 IMPLANT

## 2019-04-22 NOTE — H&P (Signed)
Date of examination:  03-23-19  Indication for surgery: to straighten the eyes and allow some binocularity  Pertinent past medical history:  Past Medical History:  Diagnosis Date  . Anxiety   . Depression   . GERD (gastroesophageal reflux disease)   . Glaucoma   . Hypertension   . Papilloma of left breast 12/10/2018    Pertinent ocular history:  XT since birth.  Strab sx 25 hears ago, details unknown.  Glaucoma  Pertinent family history:  Family History  Problem Relation Age of Onset  . Diabetes Mother   . Cancer Sister   . Cancer Other     General:  Healthy appearing patient in no distress.    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  ?re-recess RLR adjustable, re- resect RMR, vs. Recess-resect OS  Derry Skill

## 2019-04-22 NOTE — Anesthesia Procedure Notes (Signed)
Procedure Name: LMA Insertion Date/Time: 04/22/2019 9:28 AM Performed by: Lieutenant Diego, CRNA Pre-anesthesia Checklist: Patient identified, Emergency Drugs available, Suction available and Patient being monitored Patient Re-evaluated:Patient Re-evaluated prior to induction Oxygen Delivery Method: Circle system utilized Preoxygenation: Pre-oxygenation with 100% oxygen Induction Type: IV induction Ventilation: Mask ventilation without difficulty LMA: LMA flexible inserted LMA Size: 4.0 Number of attempts: 1 Placement Confirmation: positive ETCO2 and breath sounds checked- equal and bilateral Tube secured with: Tape Dental Injury: Teeth and Oropharynx as per pre-operative assessment

## 2019-04-22 NOTE — Anesthesia Postprocedure Evaluation (Signed)
Anesthesia Post Note  Patient: Alison Hansen  Procedure(s) Performed: BILATERAL STRABISMUS REPAIR (Bilateral Eye)     Patient location during evaluation: PACU Anesthesia Type: General Level of consciousness: sedated Pain management: pain level controlled Vital Signs Assessment: post-procedure vital signs reviewed and stable Respiratory status: spontaneous breathing and respiratory function stable Cardiovascular status: stable Postop Assessment: no apparent nausea or vomiting Anesthetic complications: no    Last Vitals:  Vitals:   04/22/19 1215 04/22/19 1230  BP: 119/83 119/88  Pulse: 76 79  Resp: 18 16  Temp:  36.9 C  SpO2: 94% 98%    Last Pain:  Vitals:   04/22/19 1230  TempSrc:   PainSc: 2                  Kanika Bungert DANIEL

## 2019-04-22 NOTE — Anesthesia Preprocedure Evaluation (Addendum)
Anesthesia Evaluation  Patient identified by MRN, date of birth, ID band Patient awake    Reviewed: Allergy & Precautions, NPO status , Patient's Chart, lab work & pertinent test results  History of Anesthesia Complications Negative for: history of anesthetic complications  Airway Mallampati: II  TM Distance: >3 FB Neck ROM: Full    Dental  (+) Edentulous Upper, Partial Lower, Dental Advisory Given   Pulmonary Current Smoker and Patient abstained from smoking.,    Pulmonary exam normal        Cardiovascular hypertension, Pt. on medications Normal cardiovascular exam     Neuro/Psych  Headaches, PSYCHIATRIC DISORDERS Anxiety Depression Glaucoma     GI/Hepatic Neg liver ROS, GERD  Medicated and Controlled,  Endo/Other  Left breast papilloma Obesity   Renal/GU negative Renal ROS  negative genitourinary   Musculoskeletal negative musculoskeletal ROS (+)   Abdominal (+) + obese,   Peds  Hematology negative hematology ROS (+)   Anesthesia Other Findings   Reproductive/Obstetrics HSV                            Anesthesia Physical  Anesthesia Plan  ASA: II  Anesthesia Plan: General   Post-op Pain Management:    Induction: Intravenous  PONV Risk Score and Plan: 4 or greater and Midazolam, Dexamethasone, Ondansetron, Treatment may vary due to age or medical condition and Scopolamine patch - Pre-op  Airway Management Planned: LMA  Additional Equipment:   Intra-op Plan:   Post-operative Plan: Extubation in OR  Informed Consent: I have reviewed the patients History and Physical, chart, labs and discussed the procedure including the risks, benefits and alternatives for the proposed anesthesia with the patient or authorized representative who has indicated his/her understanding and acceptance.     Dental advisory given  Plan Discussed with: CRNA, Surgeon and  Anesthesiologist  Anesthesia Plan Comments:         Anesthesia Quick Evaluation

## 2019-04-22 NOTE — Interval H&P Note (Signed)
History and Physical Interval Note:  04/22/2019 9:13 AM  Alison Hansen  has presented today for surgery, with the diagnosis of EXOTROPIA.  The various methods of treatment have been discussed with the patient and family. After consideration of risks, benefits and other options for treatment, the patient has consented to strabismus surgery, right eye or both eyes as a surgical intervention.  The patient's history has been reviewed, patient examined, no change in status, stable for surgery.  I have reviewed the patient's chart and labs.  Questions were answered to the patient's satisfaction.     Derry Skill

## 2019-04-22 NOTE — Transfer of Care (Signed)
Immediate Anesthesia Transfer of Care Note  Patient: COREEN PETRIELLO  Procedure(s) Performed: BILATERAL STRABISMUS REPAIR (Bilateral Eye)  Patient Location: PACU  Anesthesia Type:General  Level of Consciousness: awake  Airway & Oxygen Therapy: Patient Spontanous Breathing and Patient connected to face mask oxygen  Post-op Assessment: Report given to RN and Post -op Vital signs reviewed and stable  Post vital signs: Reviewed and stable  Last Vitals:  Vitals Value Taken Time  BP    Temp    Pulse    Resp    SpO2      Last Pain:  Vitals:   04/22/19 0903  TempSrc: Oral  PainSc: 0-No pain         Complications: No apparent anesthesia complications

## 2019-04-22 NOTE — Discharge Instructions (Signed)
Diet: Clear liquids, advance to soft foods then regular diet as tolerated by this evening.  Pain control:   1)  Ibuprofen 600 mg by mouth every 6-8 hours as needed for pain  2)  Ice pack/cold compress to operated eye(s) as desired  Eye medications:   Tobradex or Zylet eye ointment 1/2 inch in operated eye(s) twice a day   Activity: No swimming for 1 week.  It is OK to let water run over the face and eyes while showering or taking a bath, even during the first week.  No other restriction on exercise or activity.  Leave dressings in place until seen in Dr. Janee Morn office this afternoon for suture adjustment.   Call Dr. Janee Morn office [phone] with any problems or concerns.    No Tylenol until 3:16 PM on 04/22/2019. No Ibuprofen until 5:16 PM on 04/22/2019.    Post Anesthesia Home Care Instructions  Activity: Get plenty of rest for the remainder of the day. A responsible individual must stay with you for 24 hours following the procedure.  For the next 24 hours, DO NOT: -Drive a car -Paediatric nurse -Drink alcoholic beverages -Take any medication unless instructed by your physician -Make any legal decisions or sign important papers.  Meals: Start with liquid foods such as gelatin or soup. Progress to regular foods as tolerated. Avoid greasy, spicy, heavy foods. If nausea and/or vomiting occur, drink only clear liquids until the nausea and/or vomiting subsides. Call your physician if vomiting continues.  Special Instructions/Symptoms: Your throat may feel dry or sore from the anesthesia or the breathing tube placed in your throat during surgery. If this causes discomfort, gargle with warm salt water. The discomfort should disappear within 24 hours.  If you had a scopolamine patch placed behind your ear for the management of post- operative nausea and/or vomiting:  1. The medication in the patch is effective for 72 hours, after which it should be removed.  Wrap patch in a  tissue and discard in the trash. Wash hands thoroughly with soap and water. 2. You may remove the patch earlier than 72 hours if you experience unpleasant side effects which may include dry mouth, dizziness or visual disturbances. 3. Avoid touching the patch. Wash your hands with soap and water after contact with the patch.     Call your surgeon if you experience:   1.  Fever over 101.0. 2.  Inability to urinate. 3.  Nausea and/or vomiting. 4.  Extreme swelling or bruising at the surgical site. 5.  Continued bleeding from the incision. 6.  Increased pain, redness or drainage from the incision. 7.  Problems related to your pain medication. 8.  Any problems and/or concerns

## 2019-04-22 NOTE — H&P (View-Only) (Signed)
Date of examination:  03-23-19  Indication for surgery: to straighten the eyes and allow some binocularity  Pertinent past medical history:  Past Medical History:  Diagnosis Date  . Anxiety   . Depression   . GERD (gastroesophageal reflux disease)   . Glaucoma   . Hypertension   . Papilloma of left breast 12/10/2018    Pertinent ocular history:  XT since birth.  Strab sx 25 hears ago, details unknown.  Glaucoma  Pertinent family history:  Family History  Problem Relation Age of Onset  . Diabetes Mother   . Cancer Sister   . Cancer Other     General:  Healthy appearing patient in no distress.    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  ?re-recess RLR adjustable, re- resect RMR, vs. Recess-resect OS  Derry Skill

## 2019-04-22 NOTE — Op Note (Signed)
04/22/2019  11:16 AM  PATIENT:  Alison Hansen    PRE-OPERATIVE DIAGNOSIS:  EXOTROPIIA  POST-OPERATIVE DIAGNOSIS:  Exotropia, recurrent  PROCEDURE:  1.  Re-recession of right lateral rectus muscle 4.0 mm (15 to 19 mm posterior to the limbus)   2.  Re-resection of right medial rectus muscle 7.0 mm   3.  Left lateral rectus muscle recession, 9.0 mm, adjustable  SURGEON:  Derry Skill, MD  ANESTHESIA:   General  COMPLICATIONS: none  OPERATIVE PROCEDURE: After routine preoperative evaluation including informed consent, the patient was taken to the operating room where she was identified by me.  General anesthesia was induced without difficulty after placement of appropriate monitors.  The patient was prepped and draped in standard sterile fashion.  A lid speculum was placed in the right eye.  Through an inferotemporal fornix incision through conjunctiva and Tenons fascia, the right lateral rectus muscle was engaged on a series of muscle hooks and carefully cleared of its surrounding fascial attachments and scar tissue, taking care to separate the muscle from the adjacent and underlying inferior oblique muscle.  The lateral rectus muscle was found inserted 15 mm posterior to the limbus.  The tendon was secured with a double-armed 6-0 Vicryl suture, with a locking bite at each border of the muscle, 1 mm from the insertion.  The muscle was disinserted, and was reattached to sclera at a measured distance of 4.0 mm posterior to the current insertion, using direct scleral passes and crossed swords fashion.  The suture ends were tied securely after the position of the muscle is been checked and found to be accurate.  Conjunctiva was closed with two 6-0 plain gut sutures.  Through an inferonasal fornix incision through conjunctiva and Tenons fascia, the right medial rectus muscle was engaged on a series of muscle hooks and cleared of its surrounding fascial attachment and scar tissue.  The  appearance of the conjunctiva and of the muscle were consistent with a previous resection.  The muscle was cleared to at least 15 mm posterior to the current insertion.  It was spread between 2 self-retaining hooks.  A 2 mm bite was taken of the center of the muscle belly at a measured distance of 7.0 mm posterior to the current insertion.  A knot was tied securely at this location.  The needle at each end of the double-armed suture was passed from the center of the muscle belly to the periphery, parallel to and 7.0 mm posterior to the insertion.  A locking bite was placed at each border of the muscle.  A resection clamp was placed on the muscle.  The muscle was disinserted.  Each pole suture was passed posteriorly to anteriorly through the corresponding end of the muscle stump, then anteriorly to posteriorly near the center of the stump, then posteriorly to anteriorly through the center of the muscle belly, just posterior to the previously placed knot.  The muscle was drawn up to the level of the original insertion.  The clamp was removed.  The suture ends were tied securely.  The portion of the muscle anterior to the sutures was carefully excised.  Conjunctiva was closed with a single 6-0 plain gut suture.  The lid speculum was transferred to the left eye.  The left lateral rectus muscle, which did not show evidence of previous surgery, was isolated via a fornix incision and secured as described for the right lateral rectus muscle.  Each pole suture was passed back into the original  insertion and crossed swords fashion.  The muscle was drawn up to the level of the original insertion.  The pole sutures were tied together at least 10 cm above sclera.  With the muscle still held up at the original insertion, the pole sutures were joined with a needle driver at a measured distance of 9.0 mm above sclera.  A noose knot was tied tightly around the pole sutures at this location.  The muscle was allowed to hang back  until the noose knot reached sclera, effecting a 9.0 mm hang-back recession of the left lateral rectus muscle.  Conjunctiva was very loosely opposed with a large loop of 6-0 plain gut, leaving the incision open to facilitate suture adjustment.  A 6-0 silk traction suture was left in place at the temporal limbus.  The pole, noose, traction, and conjunctival sutures were taped to the lids.  A sterile dressing was placed over the sutures.  TobraDex ophthalmic ointment was placed in each eye in each eye.  The patient was awakened without difficulty and taken to the recovery room in stable condition, having suffered no intraoperative or immediate postoperative complications.  Derry Skill, MD

## 2019-04-25 ENCOUNTER — Encounter: Payer: Self-pay | Admitting: *Deleted

## 2020-11-02 HISTORY — PX: COLONOSCOPY: SHX174

## 2020-11-22 ENCOUNTER — Encounter: Payer: Self-pay | Admitting: Internal Medicine

## 2021-03-25 ENCOUNTER — Ambulatory Visit (INDEPENDENT_AMBULATORY_CARE_PROVIDER_SITE_OTHER): Payer: Medicare HMO | Admitting: Gastroenterology

## 2021-03-25 ENCOUNTER — Other Ambulatory Visit: Payer: Self-pay

## 2021-03-25 ENCOUNTER — Encounter: Payer: Self-pay | Admitting: Gastroenterology

## 2021-03-25 ENCOUNTER — Telehealth: Payer: Self-pay

## 2021-03-25 VITALS — BP 188/102 | HR 87 | Temp 96.9°F | Ht 63.0 in | Wt 188.2 lb

## 2021-03-25 DIAGNOSIS — A048 Other specified bacterial intestinal infections: Secondary | ICD-10-CM | POA: Diagnosis not present

## 2021-03-25 DIAGNOSIS — Z86010 Personal history of colonic polyps: Secondary | ICD-10-CM | POA: Diagnosis not present

## 2021-03-25 MED ORDER — PEG 3350-KCL-NA BICARB-NACL 420 G PO SOLR: 4000.0000 mL | ORAL | 0 refills | Status: DC

## 2021-03-25 NOTE — Telephone Encounter (Signed)
PA for [facility] submitted via [employer]. [employer]# [id number], valid 04/09/21-05/09/21.

## 2021-03-25 NOTE — Patient Instructions (Addendum)
Colonoscopy in near future. See separate instructions.  You have been given samples of Linzess today to use ONE daily starting five days before your colonoscopy IN ADDITION TO YOUR BOWEL PREP. Complete stool test for H. pylori.

## 2021-03-25 NOTE — Progress Notes (Signed)
Primary Care Physician:  Leeanne Rio, MD Referring provider: Adelina Mings Primary Gastroenterologist:  Elon Alas. Abbey Chatters, DO   Chief Complaint  Patient presents with   Colonoscopy    Poor prep, didn't take dulcolax tabs    HPI:  Alison Hansen is a 65 y.o. female here at the request of Dr. Adelina Mings to schedule colonoscopy due to inadequate bowel prep and possible retained polyps.   Patient underwent attempted colonoscopy by Dr. Rocco Serene on November 02, 2020.  Quality of bowel prep was inadequate.  External hemorrhoids noted.  3 pedunculated polyps in the ascending colon, 9 mm in size.  9 mm polyp at 30 cm proximal to the anus, pedunculated.  Large amount of stool found in the entire colon, precluding visualization.  Small and large polyps could have been missed.  Diverticulosis.  Patient states she was given a MiraLAX prep and forgot to take 2 of her bisacodyl tablets.  Typically does not have a bowel movement every day but denies hard stools. Sometimes hemorrhoids bleed after BM. No abdominal pain. No heartburn. No dysphagia. No vomiting. No weight loss.  Patient consumes beer most days, give or take 40 ounce beer or multiple wine coolers.  Family history of brother with "stomach cancer".  No known colon cancer in the family.  Patient has a personal history of H. pylori as documented in 2008.  Unclear whether she completed treatment.  Beer most days. 40 ounce, give or take.      Current Outpatient Medications  Medication Sig Dispense Refill   latanoprost (XALATAN) 0.005 % ophthalmic solution Place 1 drop into both eyes at bedtime.      losartan (COZAAR) 50 MG tablet Take 50 mg by mouth daily.     No current facility-administered medications for this visit.    Allergies as of 03/25/2021 - Review Complete 03/25/2021  Allergen Reaction Noted   Doxycycline Rash 05/05/2018    Past Medical History:  Diagnosis Date   Anxiety    Depression    GERD (gastroesophageal  reflux disease)    Glaucoma    Hypertension    Papilloma of left breast 12/10/2018    Past Surgical History:  Procedure Laterality Date   BREAST LUMPECTOMY WITH RADIOACTIVE SEED LOCALIZATION Left 12/10/2018   Procedure: LEFT BREAST LUMPECTOMY WITH RADIOACTIVE SEED LOCALIZATION;  Surgeon: Fanny Skates, MD;  Location: [place];  Service: General;  Laterality: Left;   COLONOSCOPY  11/2020   Dr. Adelina Mings: Inadequate bowel prep.  Multiple 9 mm polyps removed, path unavailable.  Diverticulosis.   COLONOSCOPY WITH ESOPHAGOGASTRODUODENOSCOPY (EGD)  2008   Small hyperplastic rectal polyp.  Internal and external hemorrhoids.  Diffuse erythema in the body of the stomach without ulceration or erosion, H. pylori positive.   EYE SURGERY     eye sx     STRABISMUS SURGERY Bilateral 04/22/2019   Procedure: BILATERAL STRABISMUS REPAIR;  Surgeon: Everitt Amber, MD;  Location: [place];  Service: Ophthalmology;  Laterality: Bilateral;   TUBAL LIGATION     TUBAL LIGATION      Family History  Problem Relation Age of Onset   Diabetes Mother    Cancer Sister        breast cancer   Other Brother        "stomach cancer"   Cancer Other     Social History   Socioeconomic History   Marital status: Single    Spouse name: Not on file   Number of children: Not on  file   Years of education: Not on file   Highest education level: Not on file  Occupational History   Not on file  Tobacco Use   Smoking status: Every Day    Packs/day: 1.00    Types: Cigarettes   Smokeless tobacco: Never  Vaping Use   Vaping Use: Never used  Substance and Sexual Activity   Alcohol use: Yes    Comment: 40 oz some days   Drug use: No   Sexual activity: Not on file  Other Topics Concern   Not on file  Social History Narrative   Not on file   Social Determinants of Health   Financial Resource Strain: Not on file  Food Insecurity: Not on file  Transportation Needs: Not on file  Physical  Activity: Not on file  Stress: Not on file  Social Connections: Not on file  Intimate Partner Violence: Not on file      ROS:  General: Negative for anorexia, weight loss, fever, chills, fatigue, weakness. Eyes: Negative for vision changes.  ENT: Negative for hoarseness, difficulty swallowing , nasal congestion. CV: Negative for chest pain, angina, palpitations, dyspnea on exertion, peripheral edema.  Respiratory: Negative for dyspnea at rest, dyspnea on exertion, cough, sputum, wheezing.  GI: See history of present illness. GU:  Negative for dysuria, hematuria, urinary incontinence, urinary frequency, nocturnal urination.  MS: Negative for joint pain, low back pain.  Derm: Negative for rash or itching.  Neuro: Negative for weakness, abnormal sensation, seizure, frequent headaches, memory loss, confusion.  Psych: Negative for anxiety, depression, suicidal ideation, hallucinations.  Endo: Negative for unusual weight change.  Heme: Negative for bruising or bleeding. Allergy: Negative for rash or hives.    Physical Examination:  BP (!) 188/102   Pulse 87   Temp (!) 96.9 F (36.1 C) (Temporal)   Ht 5\' 3"  (1.6 m)   Wt 188 lb 3.2 oz (85.4 kg)   BMI 33.34 kg/m    General: Well-nourished, well-developed in no acute distress.  Head: Normocephalic, atraumatic.   Eyes: Conjunctiva pink, no icterus. Mouth:masked. Neck: Supple without thyromegaly, masses, or lymphadenopathy.  Lungs: Clear to auscultation bilaterally.  Heart: Regular rate and rhythm, no murmurs rubs or gallops.  Abdomen: Bowel sounds are normal, nontender, nondistended, no hepatosplenomegaly or masses, no abdominal bruits or    hernia , no rebound or guarding.   Rectal: not performed Extremities: No lower extremity edema. No clubbing or deformities.  Neuro: Alert and oriented x 4 , grossly normal neurologically.  Skin: Warm and dry, no rash or jaundice.   Psych: Alert and cooperative, normal mood and  affect.    Imaging Studies: No results found.   Assessment/Plan:  History of colon polyps: Recent incomplete colonoscopy due to inadequate bowel prep.  Patient describes taking MiraLAX prep.  Given history of several large polyps removed, she needs completed colonoscopy at this time.  We will plan for extended clear liquids, 2-day bowel prep.  Start Linzess 4 days prior to prep.  Samples of this provided.  Colonoscopy with Dr. Abbey Chatters.  ASA 2.  I have discussed the risks, alternatives, benefits with regards to but not limited to the risk of reaction to medication, bleeding, infection, perforation and the patient is agreeable to proceed. Written consent to be obtained.  History of H. pylori gastritis, family history of stomach cancer: Check for H. pylori eradication with stool antigen.

## 2021-03-25 NOTE — H&P (View-Only) (Signed)
Primary Care Physician:  Leeanne Rio, MD Referring provider: Adelina Mings Primary Gastroenterologist:  Elon Alas. Abbey Chatters, DO   Chief Complaint  Patient presents with   Colonoscopy    Poor prep, didn't take dulcolax tabs    HPI:  Alison Hansen is a 65 y.o. female here at the request of Dr. Adelina Mings to schedule colonoscopy due to inadequate bowel prep and possible retained polyps.   Patient underwent attempted colonoscopy by Dr. Rocco Serene on November 02, 2020.  Quality of bowel prep was inadequate.  External hemorrhoids noted.  3 pedunculated polyps in the ascending colon, 9 mm in size.  9 mm polyp at 30 cm proximal to the anus, pedunculated.  Large amount of stool found in the entire colon, precluding visualization.  Small and large polyps could have been missed.  Diverticulosis.  Patient states she was given a MiraLAX prep and forgot to take 2 of her bisacodyl tablets.  Typically does not have a bowel movement every day but denies hard stools. Sometimes hemorrhoids bleed after BM. No abdominal pain. No heartburn. No dysphagia. No vomiting. No weight loss.  Patient consumes beer most days, give or take 40 ounce beer or multiple wine coolers.  Family history of brother with "stomach cancer".  No known colon cancer in the family.  Patient has a personal history of H. pylori as documented in 2008.  Unclear whether she completed treatment.  Beer most days. 40 ounce, give or take.      Current Outpatient Medications  Medication Sig Dispense Refill   latanoprost (XALATAN) 0.005 % ophthalmic solution Place 1 drop into both eyes at bedtime.      losartan (COZAAR) 50 MG tablet Take 50 mg by mouth daily.     No current facility-administered medications for this visit.    Allergies as of 03/25/2021 - Review Complete 03/25/2021  Allergen Reaction Noted   Doxycycline Rash 05/05/2018    Past Medical History:  Diagnosis Date   Anxiety    Depression    GERD (gastroesophageal  reflux disease)    Glaucoma    Hypertension    Papilloma of left breast 12/10/2018    Past Surgical History:  Procedure Laterality Date   BREAST LUMPECTOMY WITH RADIOACTIVE SEED LOCALIZATION Left 12/10/2018   Procedure: LEFT BREAST LUMPECTOMY WITH RADIOACTIVE SEED LOCALIZATION;  Surgeon: Fanny Skates, MD;  Location: [place];  Service: General;  Laterality: Left;   COLONOSCOPY  11/2020   Dr. Adelina Mings: Inadequate bowel prep.  Multiple 9 mm polyps removed, path unavailable.  Diverticulosis.   COLONOSCOPY WITH ESOPHAGOGASTRODUODENOSCOPY (EGD)  2008   Small hyperplastic rectal polyp.  Internal and external hemorrhoids.  Diffuse erythema in the body of the stomach without ulceration or erosion, H. pylori positive.   EYE SURGERY     eye sx     STRABISMUS SURGERY Bilateral 04/22/2019   Procedure: BILATERAL STRABISMUS REPAIR;  Surgeon: Everitt Amber, MD;  Location: [facility];  Service: Ophthalmology;  Laterality: Bilateral;   TUBAL LIGATION     TUBAL LIGATION      Family History  Problem Relation Age of Onset   Diabetes Mother    Cancer Sister        breast cancer   Other Brother        "stomach cancer"   Cancer Other     Social History   Socioeconomic History   Marital status: Single    Spouse name: Not on file   Number of children: Not on  file   Years of education: Not on file   Highest education level: Not on file  Occupational History   Not on file  Tobacco Use   Smoking status: Every Day    Packs/day: 1.00    Types: Cigarettes   Smokeless tobacco: Never  Vaping Use   Vaping Use: Never used  Substance and Sexual Activity   Alcohol use: Yes    Comment: 40 oz some days   Drug use: No   Sexual activity: Not on file  Other Topics Concern   Not on file  Social History Narrative   Not on file   Social Determinants of Health   Financial Resource Strain: Not on file  Food Insecurity: Not on file  Transportation Needs: Not on file  Physical  Activity: Not on file  Stress: Not on file  Social Connections: Not on file  Intimate Partner Violence: Not on file      ROS:  General: Negative for anorexia, weight loss, fever, chills, fatigue, weakness. Eyes: Negative for vision changes.  ENT: Negative for hoarseness, difficulty swallowing , nasal congestion. CV: Negative for chest pain, angina, palpitations, dyspnea on exertion, peripheral edema.  Respiratory: Negative for dyspnea at rest, dyspnea on exertion, cough, sputum, wheezing.  GI: See history of present illness. GU:  Negative for dysuria, hematuria, urinary incontinence, urinary frequency, nocturnal urination.  MS: Negative for joint pain, low back pain.  Derm: Negative for rash or itching.  Neuro: Negative for weakness, abnormal sensation, seizure, frequent headaches, memory loss, confusion.  Psych: Negative for anxiety, depression, suicidal ideation, hallucinations.  Endo: Negative for unusual weight change.  Heme: Negative for bruising or bleeding. Allergy: Negative for rash or hives.    Physical Examination:  BP (!) 188/102   Pulse 87   Temp (!) 96.9 F (36.1 C) (Temporal)   Ht 5\' 3"  (1.6 m)   Wt 188 lb 3.2 oz (85.4 kg)   BMI 33.34 kg/m    General: Well-nourished, well-developed in no acute distress.  Head: Normocephalic, atraumatic.   Eyes: Conjunctiva pink, no icterus. Mouth:masked. Neck: Supple without thyromegaly, masses, or lymphadenopathy.  Lungs: Clear to auscultation bilaterally.  Heart: Regular rate and rhythm, no murmurs rubs or gallops.  Abdomen: Bowel sounds are normal, nontender, nondistended, no hepatosplenomegaly or masses, no abdominal bruits or    hernia , no rebound or guarding.   Rectal: not performed Extremities: No lower extremity edema. No clubbing or deformities.  Neuro: Alert and oriented x 4 , grossly normal neurologically.  Skin: Warm and dry, no rash or jaundice.   Psych: Alert and cooperative, normal mood and  affect.    Imaging Studies: No results found.   Assessment/Plan:  History of colon polyps: Recent incomplete colonoscopy due to inadequate bowel prep.  Patient describes taking MiraLAX prep.  Given history of several large polyps removed, she needs completed colonoscopy at this time.  We will plan for extended clear liquids, 2-day bowel prep.  Start Linzess 4 days prior to prep.  Samples of this provided.  Colonoscopy with Dr. Abbey Chatters.  ASA 2.  I have discussed the risks, alternatives, benefits with regards to but not limited to the risk of reaction to medication, bleeding, infection, perforation and the patient is agreeable to proceed. Written consent to be obtained.  History of H. pylori gastritis, family history of stomach cancer: Check for H. pylori eradication with stool antigen.

## 2021-03-28 LAB — H. PYLORI ANTIGEN, STOOL: H pylori Ag, Stl: NEGATIVE

## 2021-04-09 ENCOUNTER — Ambulatory Visit (HOSPITAL_COMMUNITY): Payer: Medicare HMO | Admitting: Anesthesiology

## 2021-04-09 ENCOUNTER — Encounter (HOSPITAL_COMMUNITY)
Admission: RE | Disposition: A | Discharge status: Home / Self Care | Payer: Self-pay | Source: Home / Self Care | Attending: Internal Medicine

## 2021-04-09 ENCOUNTER — Other Ambulatory Visit: Payer: Self-pay

## 2021-04-09 ENCOUNTER — Encounter (HOSPITAL_COMMUNITY): Payer: Self-pay

## 2021-04-09 ENCOUNTER — Ambulatory Visit (HOSPITAL_COMMUNITY)
Admission: RE | Admit: 2021-04-09 | Discharge: 2021-04-09 | Disposition: A | Discharge status: Home / Self Care | Payer: Medicare HMO | Attending: Internal Medicine | Admitting: Internal Medicine

## 2021-04-09 DIAGNOSIS — D120 Benign neoplasm of cecum: Secondary | ICD-10-CM | POA: Insufficient documentation

## 2021-04-09 DIAGNOSIS — I10 Essential (primary) hypertension: Secondary | ICD-10-CM | POA: Insufficient documentation

## 2021-04-09 DIAGNOSIS — Z8619 Personal history of other infectious and parasitic diseases: Secondary | ICD-10-CM | POA: Diagnosis not present

## 2021-04-09 DIAGNOSIS — Z09 Encounter for follow-up examination after completed treatment for conditions other than malignant neoplasm: Secondary | ICD-10-CM | POA: Diagnosis present

## 2021-04-09 DIAGNOSIS — F17210 Nicotine dependence, cigarettes, uncomplicated: Secondary | ICD-10-CM | POA: Diagnosis not present

## 2021-04-09 DIAGNOSIS — K648 Other hemorrhoids: Secondary | ICD-10-CM | POA: Insufficient documentation

## 2021-04-09 DIAGNOSIS — Z79899 Other long term (current) drug therapy: Secondary | ICD-10-CM | POA: Diagnosis not present

## 2021-04-09 DIAGNOSIS — Z86010 Personal history of colonic polyps: Secondary | ICD-10-CM | POA: Diagnosis not present

## 2021-04-09 DIAGNOSIS — D123 Benign neoplasm of transverse colon: Secondary | ICD-10-CM | POA: Insufficient documentation

## 2021-04-09 HISTORY — PX: COLONOSCOPY WITH PROPOFOL: SHX5780

## 2021-04-09 HISTORY — PX: POLYPECTOMY: SHX5525

## 2021-04-09 SURGERY — COLONOSCOPY WITH PROPOFOL
Anesthesia: General

## 2021-04-09 MED ORDER — PROPOFOL 10 MG/ML IV BOLUS
INTRAVENOUS | Status: DC | PRN
  Administered 2021-04-09 (×2): 50 mg via INTRAVENOUS
  Administered 2021-04-09: 100 mg via INTRAVENOUS
  Administered 2021-04-09 (×2): 50 mg via INTRAVENOUS
  Administered 2021-04-09: 100 mg via INTRAVENOUS

## 2021-04-09 MED ORDER — LACTATED RINGERS IV SOLN: INTRAVENOUS | Status: DC

## 2021-04-09 NOTE — Anesthesia Preprocedure Evaluation (Signed)
Anesthesia Evaluation  Patient identified by MRN, date of birth, ID band Patient awake    Reviewed: Allergy & Precautions, NPO status , Patient's Chart, lab work & pertinent test results  History of Anesthesia Complications Negative for: history of anesthetic complications  Airway Mallampati: II  TM Distance: >3 FB Neck ROM: Full    Dental  (+) Dental Advisory Given, Edentulous Upper, Missing,    Pulmonary Current Smoker and Patient abstained from smoking.,    Pulmonary exam normal breath sounds clear to auscultation       Cardiovascular Exercise Tolerance: Good hypertension, Pt. on medications Normal cardiovascular exam Rhythm:Regular Rate:Normal     Neuro/Psych  Headaches, PSYCHIATRIC DISORDERS Anxiety Depression    GI/Hepatic GERD  Controlled,(+)     substance abuse  alcohol use,   Endo/Other  negative endocrine ROS  Renal/GU negative Renal ROS     Musculoskeletal   Abdominal   Peds  Hematology negative hematology ROS (+)   Anesthesia Other Findings   Reproductive/Obstetrics                             Anesthesia Physical Anesthesia Plan  ASA: 2  Anesthesia Plan: General   Post-op Pain Management: Minimal or no pain anticipated   Induction:   PONV Risk Score and Plan: TIVA  Airway Management Planned: Nasal Cannula and Natural Airway  Additional Equipment:   Intra-op Plan:   Post-operative Plan:   Informed Consent: I have reviewed the patients History and Physical, chart, labs and discussed the procedure including the risks, benefits and alternatives for the proposed anesthesia with the patient or authorized representative who has indicated his/her understanding and acceptance.     Dental advisory given  Plan Discussed with: CRNA and Surgeon  Anesthesia Plan Comments:         Anesthesia Quick Evaluation

## 2021-04-09 NOTE — Op Note (Signed)
[facility] Patient Name: Alison Hansen Procedure Date: 04/09/2021 8:52 AM MRN: [id number] Date of Birth: 04/26/1956 Attending MD: Elon Alas. Abbey Chatters DO CSN: [id number] Age: 65 Admit Type: Outpatient Procedure:                Colonoscopy Indications:              High risk colon cancer surveillance: Personal                            history of colonic polyps Providers:                Elon Alas. Abbey Chatters, DO, Charlsie Quest. Theda Sers RN, RN,                            Nelma Rothman, Technician Referring MD:              Medicines:                See the Anesthesia note for documentation of the                            administered medications Complications:            No immediate complications. Estimated Blood Loss:     Estimated blood loss was minimal. Procedure:                Pre-Anesthesia Assessment:                           - The anesthesia plan was to use monitored                            anesthesia care (MAC).                           After obtaining informed consent, the colonoscope                            was passed under direct vision. Throughout the                            procedure, the patient's blood pressure, pulse, and                            oxygen saturations were monitored continuously. The                            [id number]-HQ190L ([id number]) scope was introduced through                            the anus and advanced to the the cecum, identified                            by appendiceal orifice and ileocecal valve. The                            colonoscopy was performed without difficulty.  The                            patient tolerated the procedure well. The quality                            of the bowel preparation was evaluated using the                            BBPS [facility] Bowel Preparation Scale) with scores                            of: Right Colon = 3, Transverse Colon = 3 and Left                            Colon = 3 (entire mucosa seen  well with no residual                            staining, small fragments of stool or opaque                            liquid). The total BBPS score equals 9. Scope In: 9:03:59 AM Scope Out: 9:22:59 AM Scope Withdrawal Time: 0 hours 15 minutes 24 seconds  Total Procedure Duration: 0 hours 19 minutes 0 seconds  Findings:      The perianal and digital rectal examinations were normal.      Non-bleeding internal hemorrhoids were found during endoscopy.      A 2 mm polyp was found in the cecum. The polyp was sessile. The polyp       was removed with a cold biopsy forceps. Resection and retrieval were       complete.      Two sessile polyps were found in the cecum. The polyps were 3 to 4 mm in       size. These polyps were removed with a cold snare. Resection and       retrieval were complete.      A 10 mm polyp was found in the cecum. The polyp was sessile. The polyp       was removed with a cold snare. Resection and retrieval were complete.      A 4 mm polyp was found in the transverse colon. The polyp was sessile.       The polyp was removed with a cold snare. Resection and retrieval were       complete. Impression:               - Non-bleeding internal hemorrhoids.                           - One 2 mm polyp in the cecum, removed with a cold                            biopsy forceps. Resected and retrieved.                           - Two 3 to 4 mm polyps in the cecum, removed  with a                            cold snare. Resected and retrieved.                           - One 10 mm polyp in the cecum, removed with a cold                            snare. Resected and retrieved.                           - One 4 mm polyp in the transverse colon, removed                            with a cold snare. Resected and retrieved. Moderate Sedation:      Per Anesthesia Care Recommendation:           - Patient has a contact number available for                            emergencies. The signs  and symptoms of potential                            delayed complications were discussed with the                            patient. Return to normal activities tomorrow.                            Written discharge instructions were provided to the                            patient.                           - Resume previous diet.                           - Continue present medications.                           - Await pathology results.                           - Repeat colonoscopy in 3 years for surveillance.                           - Return to GI clinic PRN. Procedure Code(s):        --- Professional ---                           [phone], Colonoscopy, flexible; with removal of                            tumor(s), polyp(s), or other lesion(s) by snare  technique                           45380, 59, Colonoscopy, flexible; with biopsy,                            single or multiple Diagnosis Code(s):        --- Professional ---                           K63.5, Polyp of colon                           Z86.010, Personal history of colonic polyps                           K64.8, Other hemorrhoids CPT copyright 2019 [employer]. All rights reserved. The codes documented in this report are preliminary and upon coder review may  be revised to meet current compliance requirements. Elon Alas. Abbey Chatters, DO [place] Abbey Chatters, DO 04/09/2021 9:30:37 AM This report has been signed electronically. Number of Addenda: 0

## 2021-04-09 NOTE — Discharge Instructions (Addendum)
  Colonoscopy Discharge Instructions  Read the instructions outlined below and refer to this sheet in the next few weeks. These discharge instructions provide you with general information on caring for yourself after you leave the hospital. Your doctor may also give you specific instructions. While your treatment has been planned according to the most current medical practices available, unavoidable complications occasionally occur.   ACTIVITY You may resume your regular activity, but move at a slower pace for the next 24 hours.  Take frequent rest periods for the next 24 hours.  Walking will help get rid of the air and reduce the bloated feeling in your belly (abdomen).  No driving for 24 hours (because of the medicine (anesthesia) used during the test).   Do not sign any important legal documents or operate any machinery for 24 hours (because of the anesthesia used during the test).  NUTRITION Drink plenty of fluids.  You may resume your normal diet as instructed by your doctor.  Begin with a light meal and progress to your normal diet. Heavy or fried foods are harder to digest and may make you feel sick to your stomach (nauseated).  Avoid alcoholic beverages for 24 hours or as instructed.  MEDICATIONS You may resume your normal medications unless your doctor tells you otherwise.  WHAT YOU CAN EXPECT TODAY Some feelings of bloating in the abdomen.  Passage of more gas than usual.  Spotting of blood in your stool or on the toilet paper.  IF YOU HAD POLYPS REMOVED DURING THE COLONOSCOPY: No aspirin products for 7 days or as instructed.  No alcohol for 7 days or as instructed.  Eat a soft diet for the next 24 hours.  FINDING OUT THE RESULTS OF YOUR TEST Not all test results are available during your visit. If your test results are not back during the visit, make an appointment with your caregiver to find out the results. Do not assume everything is normal if you have not heard from your  caregiver or the medical facility. It is important for you to follow up on all of your test results.  SEEK IMMEDIATE MEDICAL ATTENTION IF: You have more than a spotting of blood in your stool.  Your belly is swollen (abdominal distention).  You are nauseated or vomiting.  You have a temperature over 101.  You have abdominal pain or discomfort that is severe or gets worse throughout the day.   Your colonoscopy revealed 5 polyp(s) which I removed successfully. Await pathology results, my office will contact you. I recommend repeating colonoscopy in 3 years for surveillance purposes. Otherwise follow up with GI as needed.    I hope you have a great rest of your week!  Elon Alas. Abbey Chatters, D.O. Gastroenterology and Hepatology [facility]

## 2021-04-09 NOTE — Interval H&P Note (Signed)
History and Physical Interval Note:  04/09/2021 8:36 AM  Alison Hansen  has presented today for surgery, with the diagnosis of history of colon polyps.  The various methods of treatment have been discussed with the patient and family. After consideration of risks, benefits and other options for treatment, the patient has consented to  Procedure(s) with comments: COLONOSCOPY WITH PROPOFOL (N/A) - 9:00am as a surgical intervention.  The patient's history has been reviewed, patient examined, no change in status, stable for surgery.  I have reviewed the patient's chart and labs.  Questions were answered to the patient's satisfaction.     Eloise Harman

## 2021-04-09 NOTE — Transfer of Care (Signed)
Immediate Anesthesia Transfer of Care Note  Patient: Alison Hansen  Procedure(s) Performed: COLONOSCOPY WITH PROPOFOL POLYPECTOMY  Patient Location: Endoscopy Unit  Anesthesia Type:General  Level of Consciousness: awake and alert   Airway & Oxygen Therapy: Patient Spontanous Breathing  Post-op Assessment: Report given to RN and Post -op Vital signs reviewed and stable  Post vital signs: Reviewed and stable  Last Vitals:  Vitals Value Taken Time  BP    Temp    Pulse    Resp    SpO2      Last Pain:  Vitals:   04/09/21 0900  TempSrc:   PainSc: 0-No pain      Patients Stated Pain Goal: 6 (92/90/90 3014)  Complications: No notable events documented.

## 2021-04-09 NOTE — Anesthesia Postprocedure Evaluation (Signed)
Anesthesia Post Note  Patient: Alison Hansen  Procedure(s) Performed: COLONOSCOPY WITH PROPOFOL POLYPECTOMY  Patient location during evaluation: Endoscopy Anesthesia Type: General Level of consciousness: awake and alert and oriented Pain management: pain level controlled Vital Signs Assessment: post-procedure vital signs reviewed and stable Respiratory status: spontaneous breathing, nonlabored ventilation and respiratory function stable Cardiovascular status: blood pressure returned to baseline and stable Postop Assessment: no apparent nausea or vomiting Anesthetic complications: no   No notable events documented.   Last Vitals:  Vitals:   04/09/21 0821 04/09/21 0926  BP: (!) 164/94 116/78  Pulse:    Resp:  (!) 21  Temp:  36.5 C  SpO2:  99%    Last Pain:  Vitals:   04/09/21 0926  TempSrc: Oral  PainSc: 0-No pain                 Khamari Sheehan C Ewelina Naves

## 2021-04-10 LAB — SURGICAL PATHOLOGY

## 2021-04-12 ENCOUNTER — Encounter (HOSPITAL_COMMUNITY): Payer: Self-pay | Admitting: Internal Medicine

## 2021-05-28 IMAGING — MG NEEDLE LOCALIZATION OF THE LEFT BREAST WITH MAMMO GUIDANCE
5 series · 5 of 5 positions shown · non-contrast
Comparison: Previous exam(s).

CLINICAL DATA: Patient with a LEFT breast papilloma scheduled for
surgical excision requiring preoperative radioactive seed
localization.

EXAM:
MAMMOGRAPHIC GUIDED RADIOACTIVE SEED LOCALIZATION OF THE LEFT BREAST

[L ML (1 of 3)]
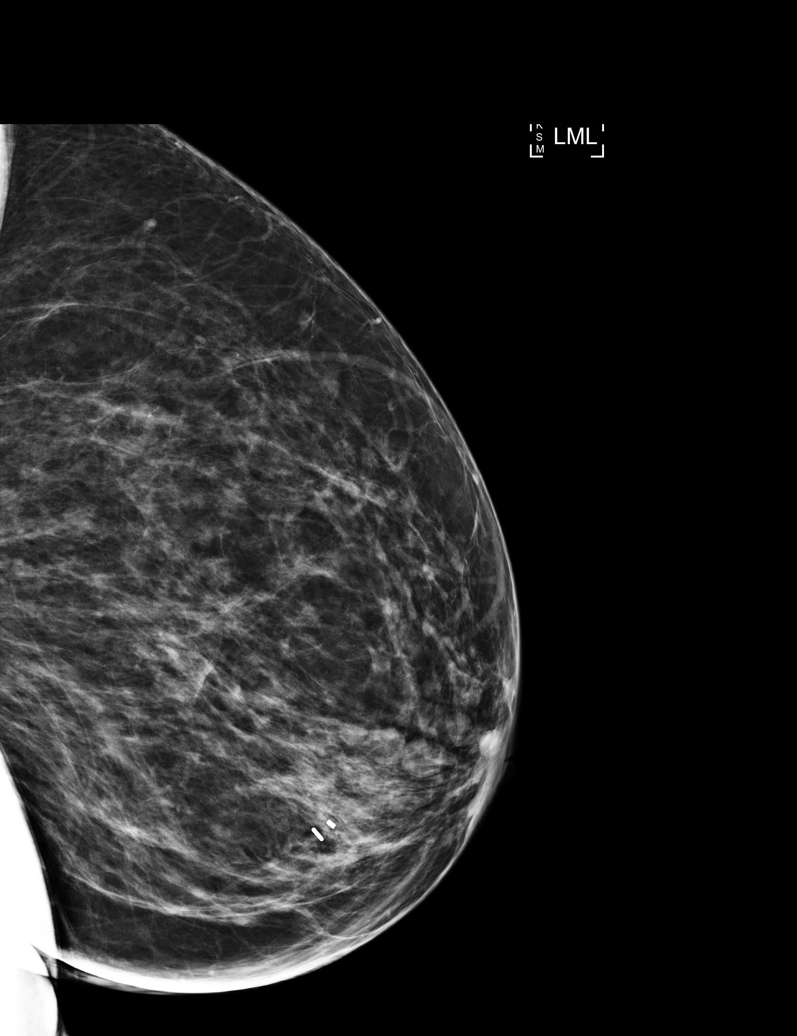

[L ML (2 of 3)]
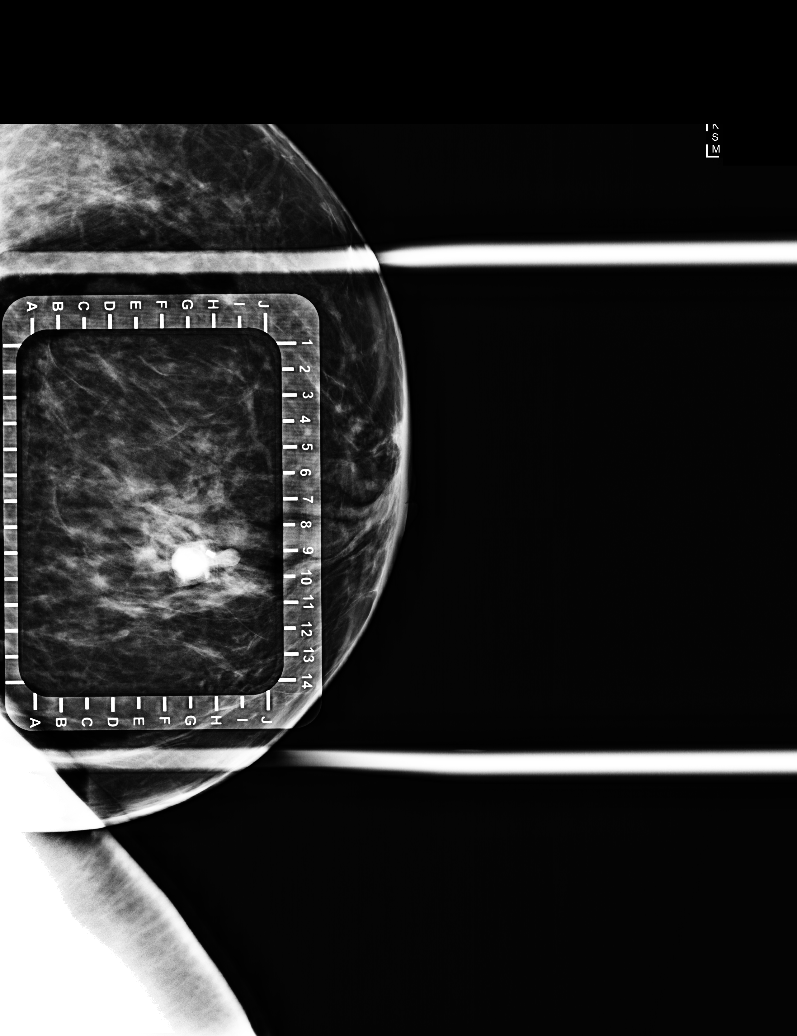

[L CC (1 of 2)]
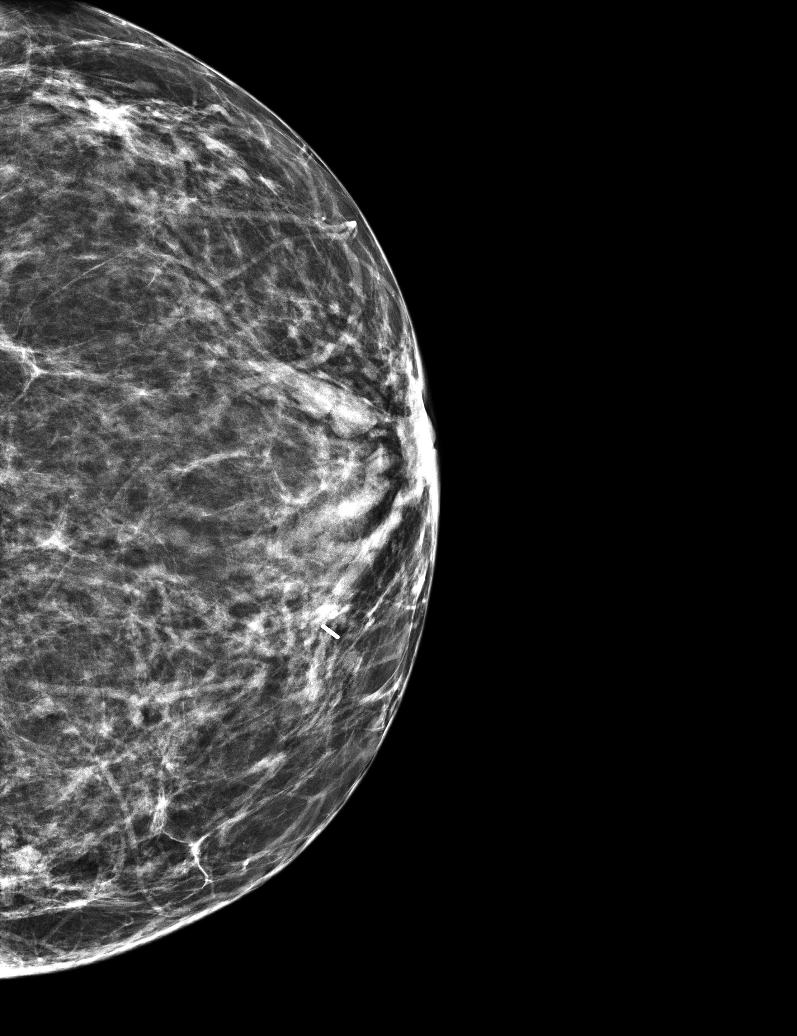

[L CC (2 of 2)]
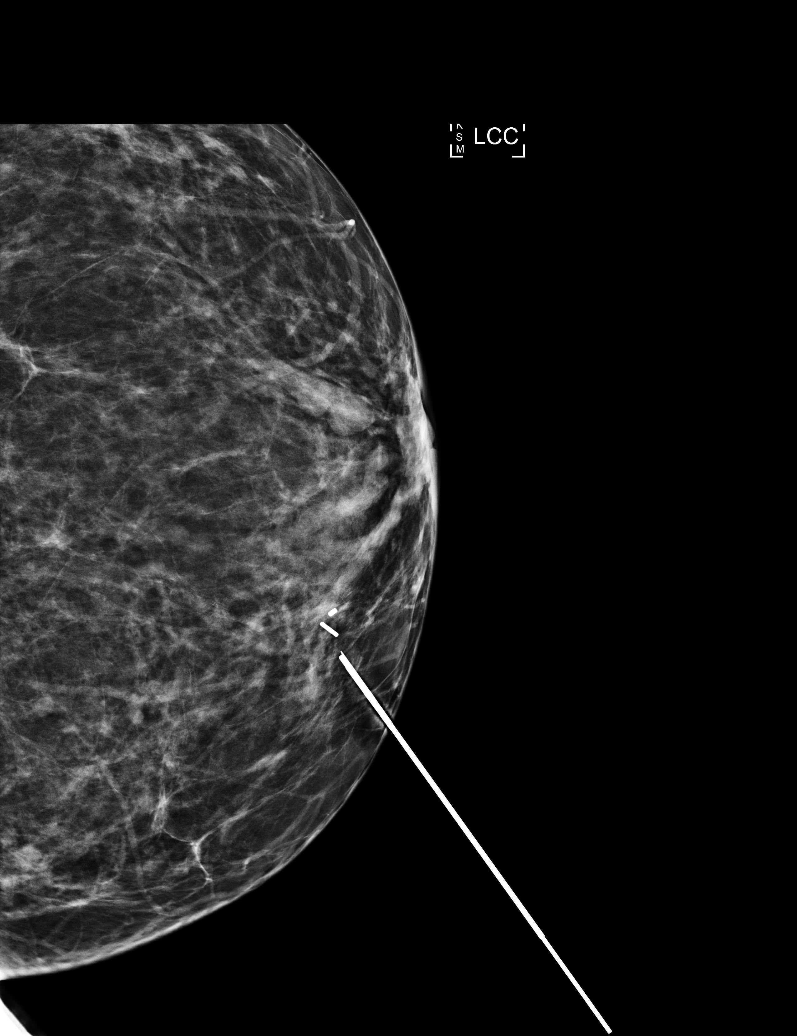

[L ML (3 of 3)]
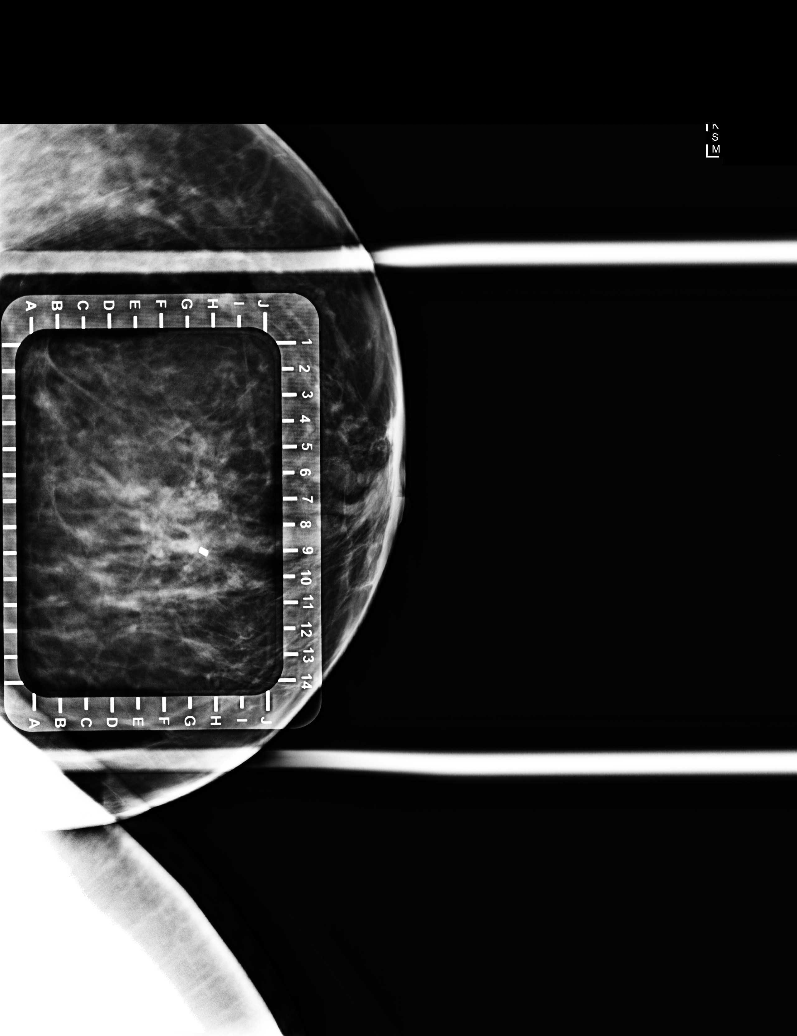

[5 of 5 positions shown; findings below may reference images not displayed]

FINDINGS: Patient presents for radioactive seed localization prior to surgical
excision. I met with the patient and we discussed the procedure of
seed localization including benefits and alternatives. We discussed
the high likelihood of a successful procedure. We discussed the
risks of the procedure including infection, bleeding, tissue injury
and further surgery. We discussed the low dose of radioactivity
involved in the procedure. Informed, written consent was given.

The usual time-out protocol was performed immediately prior to the
procedure.

Using mammographic guidance, sterile technique, 1% lidocaine and an
[id number] radioactive seed, the biopsy clip within the inner LEFT breast
was localized using a medial approach. The follow-up mammogram
images confirm the seed in the expected location and were marked for
Dr. Navjot.

Follow-up survey of the patient confirms presence of the radioactive
seed.

Order number of V-E6K seed:  [id number].

Total activity:  0.251 millicuries reference Date: 12/03/2018

The patient tolerated the procedure well and was released from the
[REDACTED]. She was given instructions regarding seed removal.
IMPRESSION: Radioactive seed localization left breast. No apparent
complications.

## 2021-05-29 IMAGING — MG BREAST SURGICAL SPECIMEN
1 series · 1 of 1 positions shown · non-contrast
Comparison: Previous exam(s).

CLINICAL DATA: Post left breast excision.

EXAM:
SPECIMEN RADIOGRAPH OF THE LEFT BREAST

[L]
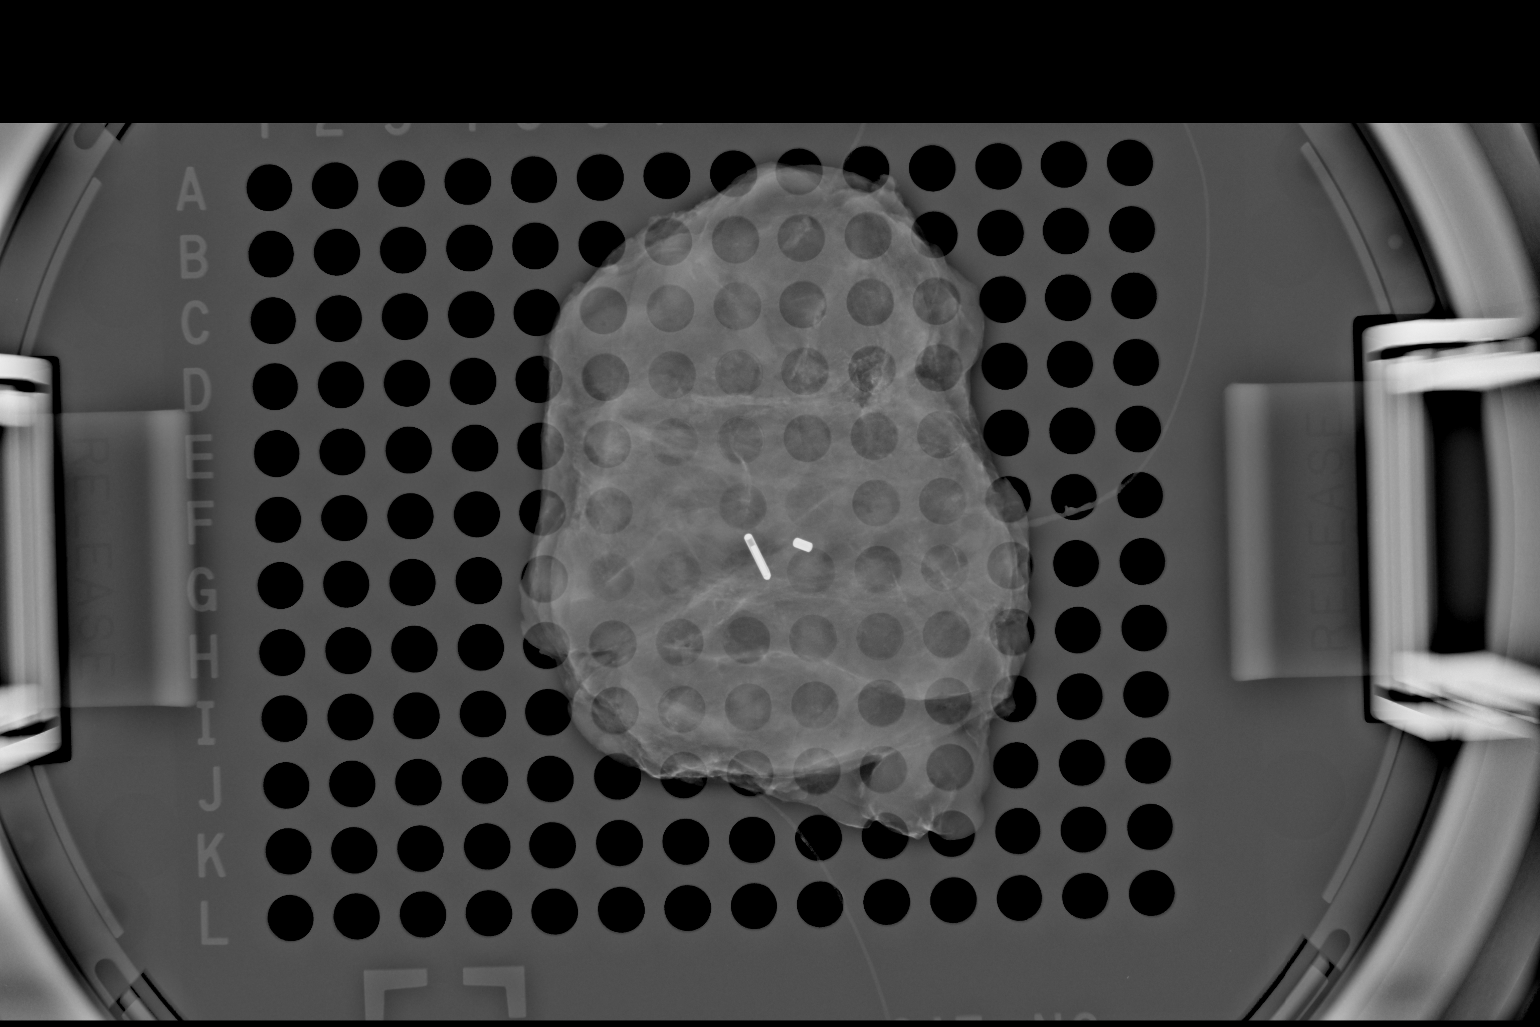

[1 of 1 positions shown; findings below may reference images not displayed]

FINDINGS: Status post excision of the left breast. The radioactive seed and
biopsy marker clip are present, completely intact, and were marked
for pathology.
IMPRESSION: Specimen radiograph of the left breast.

## 2022-03-21 ENCOUNTER — Other Ambulatory Visit: Payer: Self-pay

## 2022-03-21 DIAGNOSIS — D242 Benign neoplasm of left breast: Secondary | ICD-10-CM

## 2022-03-25 ENCOUNTER — Other Ambulatory Visit: Payer: Self-pay | Admitting: Surgery

## 2022-03-25 DIAGNOSIS — D242 Benign neoplasm of left breast: Secondary | ICD-10-CM

## 2022-04-19 ENCOUNTER — Ambulatory Visit
Admission: RE | Admit: 2022-04-19 | Discharge: 2022-04-19 | Disposition: A | Discharge status: Home / Self Care | Payer: Medicare Other | Source: Ambulatory Visit | Attending: Surgery | Admitting: Surgery

## 2022-04-19 DIAGNOSIS — D242 Benign neoplasm of left breast: Secondary | ICD-10-CM

## 2022-04-19 MED ORDER — GADOPICLENOL 0.5 MMOL/ML IV SOLN
9.0000 mL | Freq: Once | INTRAVENOUS | Status: AC | PRN
  Administered 2022-04-19: 9 mL via INTRAVENOUS

## 2022-05-08 ENCOUNTER — Other Ambulatory Visit: Payer: Self-pay | Admitting: Family Medicine

## 2022-05-08 DIAGNOSIS — R9389 Abnormal findings on diagnostic imaging of other specified body structures: Secondary | ICD-10-CM

## 2022-05-15 ENCOUNTER — Ambulatory Visit
Admission: RE | Admit: 2022-05-15 | Discharge: 2022-05-15 | Disposition: A | Discharge status: Home / Self Care | Payer: 59 | Source: Ambulatory Visit | Attending: Family Medicine | Admitting: Family Medicine

## 2022-05-15 DIAGNOSIS — N6012 Diffuse cystic mastopathy of left breast: Secondary | ICD-10-CM | POA: Diagnosis not present

## 2022-05-15 DIAGNOSIS — R9389 Abnormal findings on diagnostic imaging of other specified body structures: Secondary | ICD-10-CM

## 2022-05-15 DIAGNOSIS — D242 Benign neoplasm of left breast: Secondary | ICD-10-CM | POA: Diagnosis not present

## 2022-05-15 DIAGNOSIS — N6321 Unspecified lump in the left breast, upper outer quadrant: Secondary | ICD-10-CM | POA: Diagnosis not present

## 2022-05-15 MED ORDER — GADOPICLENOL 0.5 MMOL/ML IV SOLN
8.0000 mL | Freq: Once | INTRAVENOUS | Status: AC | PRN
  Administered 2022-05-15: 8 mL via INTRAVENOUS

## 2022-05-29 DIAGNOSIS — D242 Benign neoplasm of left breast: Secondary | ICD-10-CM | POA: Diagnosis not present

## 2022-06-03 DIAGNOSIS — Z01818 Encounter for other preprocedural examination: Secondary | ICD-10-CM | POA: Diagnosis not present

## 2022-06-04 DIAGNOSIS — K828 Other specified diseases of gallbladder: Secondary | ICD-10-CM | POA: Diagnosis not present

## 2022-06-04 DIAGNOSIS — I10 Essential (primary) hypertension: Secondary | ICD-10-CM | POA: Diagnosis not present

## 2022-06-04 DIAGNOSIS — Z780 Asymptomatic menopausal state: Secondary | ICD-10-CM | POA: Diagnosis not present

## 2022-06-04 DIAGNOSIS — N6459 Other signs and symptoms in breast: Secondary | ICD-10-CM | POA: Diagnosis not present

## 2022-06-04 DIAGNOSIS — N6012 Diffuse cystic mastopathy of left breast: Secondary | ICD-10-CM | POA: Diagnosis not present

## 2022-06-04 DIAGNOSIS — F17210 Nicotine dependence, cigarettes, uncomplicated: Secondary | ICD-10-CM | POA: Diagnosis not present

## 2022-06-04 DIAGNOSIS — D242 Benign neoplasm of left breast: Secondary | ICD-10-CM | POA: Diagnosis not present

## 2022-09-05 DIAGNOSIS — R000 Tachycardia, unspecified: Secondary | ICD-10-CM | POA: Diagnosis not present

## 2022-09-05 DIAGNOSIS — E785 Hyperlipidemia, unspecified: Secondary | ICD-10-CM | POA: Diagnosis not present

## 2022-09-05 DIAGNOSIS — R7303 Prediabetes: Secondary | ICD-10-CM | POA: Diagnosis not present

## 2022-09-05 DIAGNOSIS — I10 Essential (primary) hypertension: Secondary | ICD-10-CM | POA: Diagnosis not present

## 2022-09-17 DIAGNOSIS — N649 Disorder of breast, unspecified: Secondary | ICD-10-CM | POA: Diagnosis not present

## 2022-09-17 DIAGNOSIS — R92322 Mammographic fibroglandular density, left breast: Secondary | ICD-10-CM | POA: Diagnosis not present

## 2022-09-17 DIAGNOSIS — N6002 Solitary cyst of left breast: Secondary | ICD-10-CM | POA: Diagnosis not present

## 2022-09-23 DIAGNOSIS — M542 Cervicalgia: Secondary | ICD-10-CM | POA: Diagnosis not present

## 2022-09-23 DIAGNOSIS — Z23 Encounter for immunization: Secondary | ICD-10-CM | POA: Diagnosis not present

## 2022-09-23 DIAGNOSIS — I10 Essential (primary) hypertension: Secondary | ICD-10-CM | POA: Diagnosis not present

## 2023-04-27 DIAGNOSIS — H524 Presbyopia: Secondary | ICD-10-CM | POA: Diagnosis not present

## 2023-04-27 DIAGNOSIS — H43812 Vitreous degeneration, left eye: Secondary | ICD-10-CM | POA: Diagnosis not present

## 2023-04-27 DIAGNOSIS — H401112 Primary open-angle glaucoma, right eye, moderate stage: Secondary | ICD-10-CM | POA: Diagnosis not present

## 2023-04-27 DIAGNOSIS — H401123 Primary open-angle glaucoma, left eye, severe stage: Secondary | ICD-10-CM | POA: Diagnosis not present

## 2023-04-27 DIAGNOSIS — H53001 Unspecified amblyopia, right eye: Secondary | ICD-10-CM | POA: Diagnosis not present

## 2023-04-27 DIAGNOSIS — H2513 Age-related nuclear cataract, bilateral: Secondary | ICD-10-CM | POA: Diagnosis not present

## 2024-03-09 ENCOUNTER — Encounter (INDEPENDENT_AMBULATORY_CARE_PROVIDER_SITE_OTHER): Payer: Self-pay | Admitting: *Deleted

## 2024-04-20 ENCOUNTER — Encounter (INDEPENDENT_AMBULATORY_CARE_PROVIDER_SITE_OTHER): Payer: Self-pay | Admitting: *Deleted
